# Patient Record
Sex: Male | Born: 1935 | Race: White | Hispanic: No | Marital: Married | Smoking: Former smoker
Health system: Southern US, Community
[De-identification: ages and names within clinical notes are randomized; demographics above are authoritative.]

## PROBLEM LIST (undated history)

## (undated) DIAGNOSIS — M069 Rheumatoid arthritis, unspecified: Secondary | ICD-10-CM

## (undated) DIAGNOSIS — Z79899 Other long term (current) drug therapy: Secondary | ICD-10-CM

## (undated) DIAGNOSIS — E119 Type 2 diabetes mellitus without complications: Secondary | ICD-10-CM

## (undated) DIAGNOSIS — Z951 Presence of aortocoronary bypass graft: Secondary | ICD-10-CM

## (undated) DIAGNOSIS — Z9889 Other specified postprocedural states: Secondary | ICD-10-CM

## (undated) DIAGNOSIS — Z8631 Personal history of diabetic foot ulcer: Secondary | ICD-10-CM

## (undated) DIAGNOSIS — I739 Peripheral vascular disease, unspecified: Secondary | ICD-10-CM

## (undated) DIAGNOSIS — I6529 Occlusion and stenosis of unspecified carotid artery: Secondary | ICD-10-CM

## (undated) DIAGNOSIS — G894 Chronic pain syndrome: Secondary | ICD-10-CM

## (undated) DIAGNOSIS — G629 Polyneuropathy, unspecified: Secondary | ICD-10-CM

## (undated) DIAGNOSIS — E785 Hyperlipidemia, unspecified: Secondary | ICD-10-CM

## (undated) HISTORY — PX: SKIN GRAFT: SHX250

## (undated) HISTORY — PX: CORONARY ARTERY BYPASS GRAFT: SHX141

## (undated) HISTORY — PX: CHOLECYSTECTOMY: SHX55

## (undated) HISTORY — PX: FEMORAL-POPLITEAL BYPASS GRAFT: SHX937

---

## 2005-06-02 ENCOUNTER — Ambulatory Visit (HOSPITAL_BASED_OUTPATIENT_CLINIC_OR_DEPARTMENT_OTHER): Admission: RE | Admit: 2005-06-02 | Discharge: 2005-06-02 | Payer: Self-pay | Admitting: Internal Medicine

## 2005-06-15 ENCOUNTER — Ambulatory Visit: Payer: Self-pay | Admitting: Internal Medicine

## 2005-08-01 ENCOUNTER — Ambulatory Visit: Payer: Self-pay | Admitting: Internal Medicine

## 2005-08-20 ENCOUNTER — Ambulatory Visit (HOSPITAL_BASED_OUTPATIENT_CLINIC_OR_DEPARTMENT_OTHER): Admission: RE | Admit: 2005-08-20 | Discharge: 2005-08-20 | Payer: Self-pay | Admitting: Internal Medicine

## 2005-08-24 ENCOUNTER — Ambulatory Visit: Payer: Self-pay | Admitting: Internal Medicine

## 2005-09-16 ENCOUNTER — Ambulatory Visit: Payer: Self-pay | Admitting: Internal Medicine

## 2005-10-30 ENCOUNTER — Ambulatory Visit: Payer: Self-pay | Admitting: Internal Medicine

## 2005-12-04 ENCOUNTER — Ambulatory Visit: Payer: Self-pay | Admitting: Internal Medicine

## 2013-06-16 ENCOUNTER — Encounter (HOSPITAL_BASED_OUTPATIENT_CLINIC_OR_DEPARTMENT_OTHER): Payer: Medicare Other | Attending: Internal Medicine

## 2013-06-16 DIAGNOSIS — I7789 Other specified disorders of arteries and arterioles: Secondary | ICD-10-CM | POA: Insufficient documentation

## 2013-06-16 DIAGNOSIS — T8189XA Other complications of procedures, not elsewhere classified, initial encounter: Secondary | ICD-10-CM | POA: Insufficient documentation

## 2013-06-16 DIAGNOSIS — Y832 Surgical operation with anastomosis, bypass or graft as the cause of abnormal reaction of the patient, or of later complication, without mention of misadventure at the time of the procedure: Secondary | ICD-10-CM | POA: Insufficient documentation

## 2013-06-16 DIAGNOSIS — Z951 Presence of aortocoronary bypass graft: Secondary | ICD-10-CM | POA: Insufficient documentation

## 2013-06-16 DIAGNOSIS — L97509 Non-pressure chronic ulcer of other part of unspecified foot with unspecified severity: Secondary | ICD-10-CM | POA: Insufficient documentation

## 2013-06-16 DIAGNOSIS — E1142 Type 2 diabetes mellitus with diabetic polyneuropathy: Secondary | ICD-10-CM | POA: Insufficient documentation

## 2013-06-16 DIAGNOSIS — Z794 Long term (current) use of insulin: Secondary | ICD-10-CM | POA: Insufficient documentation

## 2013-06-16 DIAGNOSIS — E1169 Type 2 diabetes mellitus with other specified complication: Secondary | ICD-10-CM | POA: Insufficient documentation

## 2013-06-16 DIAGNOSIS — Z79899 Other long term (current) drug therapy: Secondary | ICD-10-CM | POA: Insufficient documentation

## 2013-06-16 DIAGNOSIS — E1149 Type 2 diabetes mellitus with other diabetic neurological complication: Secondary | ICD-10-CM | POA: Insufficient documentation

## 2013-06-16 DIAGNOSIS — I872 Venous insufficiency (chronic) (peripheral): Secondary | ICD-10-CM | POA: Insufficient documentation

## 2013-06-16 DIAGNOSIS — I739 Peripheral vascular disease, unspecified: Secondary | ICD-10-CM | POA: Insufficient documentation

## 2013-06-16 DIAGNOSIS — T82898A Other specified complication of vascular prosthetic devices, implants and grafts, initial encounter: Secondary | ICD-10-CM | POA: Insufficient documentation

## 2013-06-17 NOTE — Progress Notes (Signed)
[facility]  NAME:  Nathaniel Serrano, Nathaniel Serrano NO.:  [id number]  MEDICAL RECORD NO.:  [id number]      DATE OF BIRTH:  Jan 06, 1936  PHYSICIAN:  Nathaniel Serrano, M.D.      VISIT DATE:                                  OFFICE VISIT   HISTORY:  Nathaniel Serrano is a 77 year old man who is essentially referred here by his in-home health nurse at [place].  He is here for wounds on his right foot.  He brings records from his primary physician at [facility] in [place] and also a recent followup from [facility], Nathaniel Serrano.  The history is largely supplied by his wife who is present.  Apparently, this gentleman had been followed at [facility] for a wound on his right second toe.  Apparently in the early part of this year in February, he developed a blister over the fifth metatarsal head.  This became infected.  He was admitted to hospital and underwent a "double bypass" on February 21 by Nathaniel Serrano. The distal part of the bypass is now occluded (DP bypass).  He has a peroneal bypass that is still present. Through this, he has been left with two open areas, one over the amputation site on the right fifth toe, and there was also a surgical wound on the dorsal aspect of the right foot.  They have been cleaning this meticulously and using Silvadene cream.  Home health is coming into see this once a week, and they are largely referred here by home health to see if there is anything else that can be done to the remaining surgical wound sites in spite of his obvious concerns about arterial insufficiency.  PAST MEDICAL HISTORY:  Taken from his notes from [facility] and includes history of coronary artery bypass, cholecystectomy, hemilaminectomy at L1, lithotripsy, previous attempts at bypass surgery with a fem-pop bypass in 2008 and a right popliteal and superficial artery laser arthrectomy also I  believe in 2008. Prostatectomy with history of BPH, type 2 diabetes, seizures, depression.  MEDICATIONS:  Lorazepam 0.5 b.i.d. p.r.n., oxycodone 15 mg p.o. q.4 p.r.n. amlodipine 5 mg daily, ASA 81 daily, metoprolol tartrate 25 two times daily, Lantus insulin 40 units in the morning and 45 units at night, Humalog sliding scale 3 times a day, Plavix 75 daily, hydroxyzine 10 mg 4 times daily, Remeron 7.5 at night, Lipitor 20 daily, Dilaudid 2 mg p.o. q.6 h. p.r.n., fentanyl 50 mg changed every third day, Augmentin 1 mg b.i.d.  PHYSICAL EXAMINATION:  VITAL SIGNS:  Temperature is 97.7, pulse 64, respirations 16, blood pressure 163/70, CBG is 122. VASCULAR:  I could not palpate a dorsalis pedis pulse or posterior tibial pulse on the right foot.  His capillary refill time was prolonged.  We could not obtain an ABI in this clinic.  He has probably severe venous stasis secondary lymphedema in the right leg.  However, the wound issue here is on the right fifth toe over the metatarsal head area laterally.  This measures 3 x 1 x 0.9. There are islands of epithelialization here.  Some dry wound remains.  The superior aspect of this wound had a small open area recess; however, I probed  this.  This does not drain.  This did not probe superiorly.  Over the right medial foot dorsally is a wound measuring 3.9 x 2.1 x 0.5. This underwent a nonselective debridement of surface adherent slough and eschar.  I could not clean this completely.  There is considerable depth to this, but no evidence of infection at either site is noted.  IMPRESSION AND PLAN:  Ischemic wounds in the setting of type 2 diabetes, diabetic neuropathy, and macrovascular disease.  He had a recent attempt at revascularization in February.  Apparently, the DP bypass part of this is now occluded.  In spite of this, he has already had some healing.  I think it may be possible to tease some more healing out of both of these surgical sites  in spite of his vascular limitations. Towards that end, we have applied Santyl Medihoney to the wound on his dorsal foot.  To the wound on the lateral foot over the area of the right fifth metatarsal head, we have applied a collagen Hydrogel-based dressing.  We have wrapped this and are going to leave this intact for the next week.  If we can get a better healing base to the dorsal foot wound, then Apligraf might be an option.  I have explained this in detail to the patient and his wife in painstaking fashion.  I do not believe this contradicts anything they have been told by other doctors. If infection sets in to either one of these wounds, the likely outcome of this would be an amputation, and both the patient and his wife are aware of this.  Even in the absence of this scenario, it may not be possible to get closure of these sites.  However, the patient wishes to try, and we have talked about this in some detail and will proceed with an attempt to get some closure of both of these areas.  We will see him again in a week's time.          ______________________________ Nathaniel Serrano, M.D.     MGR/MEDQ  D:  06/16/2013  T:  06/16/2013  Job:  [id number]

## 2013-07-07 ENCOUNTER — Encounter (HOSPITAL_BASED_OUTPATIENT_CLINIC_OR_DEPARTMENT_OTHER): Payer: Medicare Other | Attending: Internal Medicine

## 2013-07-07 DIAGNOSIS — I872 Venous insufficiency (chronic) (peripheral): Secondary | ICD-10-CM | POA: Insufficient documentation

## 2013-07-07 DIAGNOSIS — E1169 Type 2 diabetes mellitus with other specified complication: Secondary | ICD-10-CM | POA: Insufficient documentation

## 2013-07-07 DIAGNOSIS — L97509 Non-pressure chronic ulcer of other part of unspecified foot with unspecified severity: Secondary | ICD-10-CM | POA: Insufficient documentation

## 2013-07-07 DIAGNOSIS — I739 Peripheral vascular disease, unspecified: Secondary | ICD-10-CM | POA: Insufficient documentation

## 2013-08-04 ENCOUNTER — Encounter (HOSPITAL_BASED_OUTPATIENT_CLINIC_OR_DEPARTMENT_OTHER): Payer: Medicare Other | Attending: Internal Medicine

## 2013-08-04 DIAGNOSIS — E1169 Type 2 diabetes mellitus with other specified complication: Secondary | ICD-10-CM | POA: Insufficient documentation

## 2013-08-04 DIAGNOSIS — I872 Venous insufficiency (chronic) (peripheral): Secondary | ICD-10-CM | POA: Insufficient documentation

## 2013-08-04 DIAGNOSIS — L97509 Non-pressure chronic ulcer of other part of unspecified foot with unspecified severity: Secondary | ICD-10-CM | POA: Insufficient documentation

## 2013-08-04 DIAGNOSIS — I7389 Other specified peripheral vascular diseases: Secondary | ICD-10-CM | POA: Insufficient documentation

## 2013-09-01 ENCOUNTER — Encounter (HOSPITAL_BASED_OUTPATIENT_CLINIC_OR_DEPARTMENT_OTHER): Payer: Medicare Other | Attending: Internal Medicine

## 2013-09-01 DIAGNOSIS — L97509 Non-pressure chronic ulcer of other part of unspecified foot with unspecified severity: Secondary | ICD-10-CM | POA: Insufficient documentation

## 2013-09-01 DIAGNOSIS — I739 Peripheral vascular disease, unspecified: Secondary | ICD-10-CM | POA: Insufficient documentation

## 2013-09-01 DIAGNOSIS — E1169 Type 2 diabetes mellitus with other specified complication: Secondary | ICD-10-CM | POA: Insufficient documentation

## 2013-09-01 DIAGNOSIS — I872 Venous insufficiency (chronic) (peripheral): Secondary | ICD-10-CM | POA: Insufficient documentation

## 2013-10-06 ENCOUNTER — Encounter (HOSPITAL_BASED_OUTPATIENT_CLINIC_OR_DEPARTMENT_OTHER): Payer: Medicare Other | Attending: Internal Medicine

## 2013-10-06 DIAGNOSIS — L89899 Pressure ulcer of other site, unspecified stage: Secondary | ICD-10-CM | POA: Insufficient documentation

## 2013-10-06 DIAGNOSIS — L8992 Pressure ulcer of unspecified site, stage 2: Secondary | ICD-10-CM | POA: Insufficient documentation

## 2013-10-06 DIAGNOSIS — I739 Peripheral vascular disease, unspecified: Secondary | ICD-10-CM | POA: Insufficient documentation

## 2013-11-05 ENCOUNTER — Encounter (HOSPITAL_COMMUNITY): Payer: Self-pay | Admitting: Emergency Medicine

## 2013-11-05 ENCOUNTER — Emergency Department (HOSPITAL_COMMUNITY)
Admission: EM | Admit: 2013-11-05 | Discharge: 2013-11-06 | Disposition: A | Discharge status: Home / Self Care | Payer: Medicare Other | Attending: Emergency Medicine | Admitting: Emergency Medicine

## 2013-11-05 DIAGNOSIS — R110 Nausea: Secondary | ICD-10-CM | POA: Insufficient documentation

## 2013-11-05 DIAGNOSIS — Z8739 Personal history of other diseases of the musculoskeletal system and connective tissue: Secondary | ICD-10-CM | POA: Insufficient documentation

## 2013-11-05 DIAGNOSIS — M25569 Pain in unspecified knee: Secondary | ICD-10-CM | POA: Insufficient documentation

## 2013-11-05 DIAGNOSIS — R6883 Chills (without fever): Secondary | ICD-10-CM | POA: Insufficient documentation

## 2013-11-05 DIAGNOSIS — M2550 Pain in unspecified joint: Secondary | ICD-10-CM

## 2013-11-05 DIAGNOSIS — R509 Fever, unspecified: Secondary | ICD-10-CM | POA: Insufficient documentation

## 2013-11-05 DIAGNOSIS — Z9889 Other specified postprocedural states: Secondary | ICD-10-CM | POA: Insufficient documentation

## 2013-11-05 DIAGNOSIS — E119 Type 2 diabetes mellitus without complications: Secondary | ICD-10-CM | POA: Insufficient documentation

## 2013-11-05 DIAGNOSIS — Z951 Presence of aortocoronary bypass graft: Secondary | ICD-10-CM | POA: Insufficient documentation

## 2013-11-05 DIAGNOSIS — E871 Hypo-osmolality and hyponatremia: Secondary | ICD-10-CM

## 2013-11-05 DIAGNOSIS — R630 Anorexia: Secondary | ICD-10-CM | POA: Insufficient documentation

## 2013-11-05 HISTORY — DX: Type 2 diabetes mellitus without complications: E11.9

## 2013-11-05 HISTORY — DX: Other specified postprocedural states: Z98.890

## 2013-11-05 NOTE — ED Notes (Signed)
Pt reports having bypass surgery to the right leg due to circulation issues. Pt has been seen at wound center and now has skin over wounds on feet. Pt has had rheumatoid arthritis and has been off his medication to try to heal the right leg. This has caused worsening rheumatoid arthritis symptoms to the left knee. The patient reports swelling and pain to the knee. Pt also reports nausea today. Pt presents to ED because "their is infection somewhere in his body causing the fever." Pt was recently seen at [facility] ED, which associated infection to the left knee, however the patient's rheumatologist disagreed with that finding.

## 2013-11-06 LAB — COMPREHENSIVE METABOLIC PANEL
ALT: 8 U/L (ref 0–53)
Albumin: 3.4 g/dL — ABNORMAL LOW (ref 3.5–5.2)
Alkaline Phosphatase: 115 U/L (ref 39–117)
CO2: 20 mEq/L (ref 19–32)
Calcium: 9.2 mg/dL (ref 8.4–10.5)
Chloride: 95 mEq/L — ABNORMAL LOW (ref 96–112)
Creatinine, Ser: 1.5 mg/dL — ABNORMAL HIGH (ref 0.50–1.35)
GFR calc Af Amer: 51 mL/min — ABNORMAL LOW (ref >90)
GFR calc non Af Amer: 44 mL/min — ABNORMAL LOW (ref >90)
Glucose, Bld: 198 mg/dL — ABNORMAL HIGH (ref 70–99)
Potassium: 4.3 mEq/L (ref 3.5–5.1)
Sodium: 128 mEq/L — ABNORMAL LOW (ref 135–145)
Total Bilirubin: 0.7 mg/dL (ref 0.3–1.2)

## 2013-11-06 LAB — URINALYSIS, ROUTINE W REFLEX MICROSCOPIC
Glucose, UA: 100 mg/dL — AB
Ketones, ur: 15 mg/dL — AB
Leukocytes, UA: NEGATIVE
Protein, ur: 100 mg/dL — AB
Specific Gravity, Urine: 1.023 (ref 1.005–1.030)
Urobilinogen, UA: 0.2 mg/dL (ref 0.0–1.0)

## 2013-11-06 LAB — URINE MICROSCOPIC-ADD ON

## 2013-11-06 LAB — CBC WITH DIFFERENTIAL/PLATELET
Eosinophils Relative: 0 % (ref 0–5)
Hemoglobin: 12.2 g/dL — ABNORMAL LOW (ref 13.0–17.0)
Lymphocytes Relative: 3 % — ABNORMAL LOW (ref 12–46)
Lymphs Abs: 0.3 10*3/uL — ABNORMAL LOW (ref 0.7–4.0)
MCV: 81.3 fL (ref 78.0–100.0)
Monocytes Relative: 5 % (ref 3–12)
Neutrophils Relative %: 92 % — ABNORMAL HIGH (ref 43–77)
Platelets: 280 10*3/uL (ref 150–400)
RBC: 4.5 MIL/uL (ref 4.22–5.81)
RDW: 16.8 % — ABNORMAL HIGH (ref 11.5–15.5)
WBC: 11.3 10*3/uL — ABNORMAL HIGH (ref 4.0–10.5)

## 2013-11-06 LAB — CG4 I-STAT (LACTIC ACID): Lactic Acid, Venous: 2.11 mmol/L (ref 0.5–2.2)

## 2013-11-06 MED ORDER — OXYCODONE-ACETAMINOPHEN 5-325 MG PO TABS
2.0000 | ORAL_TABLET | Freq: Once | ORAL | Status: AC
  Administered 2013-11-06: 2 via ORAL
  Filled 2013-11-06: qty 2

## 2013-11-06 MED ORDER — ONDANSETRON HCL 4 MG/2ML IJ SOLN: 4.0000 mg | Freq: Once | INTRAMUSCULAR | Status: DC

## 2013-11-06 MED ORDER — HYDROMORPHONE HCL PF 1 MG/ML IJ SOLN
0.5000 mg | Freq: Once | INTRAMUSCULAR | Status: AC
  Administered 2013-11-06: 0.5 mg via INTRAVENOUS
  Filled 2013-11-06: qty 1

## 2013-11-06 MED ORDER — ONDANSETRON 8 MG PO TBDP: 8.0000 mg | ORAL_TABLET | Freq: Three times a day (TID) | ORAL | Status: DC | PRN

## 2013-11-06 NOTE — ED Provider Notes (Signed)
CSN: [id number]     Arrival date & time 11/05/13  2230 History   First MD Initiated Contact with Patient 11/05/13 2327     Chief Complaint  Patient presents with  . Fever  . Chills   (Consider location/radiation/quality/duration/timing/severity/associated sxs/prior Treatment) HPI 77 year old male presents to emergency room from home with complaint of fever to 100.5, chills, decreased appetite today with some mild nausea.  No cough, no upper respiratory symptoms.  No vomiting.  No abdominal pain.  No urinary symptoms.  Wife reports patient has had some low blood sugars today, primary care Dr. has been manipulating his diabetic medications.  Patient with history of CABG and right lower extremity vascular surgery with subsequent wound healing problems.  Patient has been seen at the wound care center, and has had improved healing.  Patient with history of rheumatoid arthritis, had been off methotrexate as wound healing and was not progressing.  He is since been placed back on his methotrexate.  Rheumatoid symptoms and upper extremities have improved, but still has consistent pain and swelling to his left knee.  Patient reports being seen at [facility].  Several months ago, and being told that he had infection in his left knee.  He was on antibiotics for several weeks.  He reports that his rheumatologist did not feel that he had an infection in the knee.  Patient and wife wanting to know where source of fever is today.  Past Medical History  Diagnosis Date  . History of open heart surgery   . Diabetes mellitus without complication    History reviewed. No pertinent past surgical history. No family history on file. History  Substance Use Topics  . Smoking status: Never Smoker   . Smokeless tobacco: Never Used  . Alcohol Use: No    Review of Systems  See History of Present Illness; otherwise all other systems are reviewed and negative  Allergies  Review of patient's allergies indicates  no known allergies.  Home Medications  No current outpatient prescriptions on file. BP 154/59  Pulse 74  Temp(Src) 98.5 F (36.9 C) (Oral)  Resp 18  SpO2 100% Physical Exam  Nursing note and vitals reviewed. Constitutional: He is oriented to person, place, and time. He appears well-developed and well-nourished.  HENT:  Head: Normocephalic and atraumatic.  Right Ear: External ear normal.  Left Ear: External ear normal.  Nose: Nose normal.  Mouth/Throat: Oropharynx is clear and moist.  Eyes: Conjunctivae and EOM are normal. Pupils are equal, round, and reactive to light.  Neck: Normal range of motion. Neck supple. No JVD present. No tracheal deviation present. No thyromegaly present.  Cardiovascular: Normal rate, regular rhythm, normal heart sounds and intact distal pulses.  Exam reveals no gallop and no friction rub.   No murmur heard. Pulmonary/Chest: Effort normal and breath sounds normal. No stridor. No respiratory distress. He has no wheezes. He has no rales. He exhibits no tenderness.  Abdominal: Soft. Bowel sounds are normal. He exhibits no distension and no mass. There is no tenderness. There is no rebound and no guarding.  Musculoskeletal: Normal range of motion. He exhibits edema and tenderness.  Swelling and tenderness to left knee.  No overlying erythema or warmth.  No crepitus.  Lymphadenopathy:    He has no cervical adenopathy.  Neurological: He is alert and oriented to person, place, and time. He has normal reflexes. No cranial nerve deficit. He exhibits normal muscle tone. Coordination normal.  Skin: Skin is warm and dry. No  rash noted. No erythema. No pallor.  Psychiatric: He has a normal mood and affect. His behavior is normal. Judgment and thought content normal.    ED Course  Procedures (including critical care time) Labs Review Labs Reviewed  CBC WITH DIFFERENTIAL - Abnormal; Notable for the following:    WBC 11.3 (*)    Hemoglobin 12.2 (*)    HCT 36.6 (*)     RDW 16.8 (*)    Neutrophils Relative % 92 (*)    Neutro Abs 10.4 (*)    Lymphocytes Relative 3 (*)    Lymphs Abs 0.3 (*)    All other components within normal limits  COMPREHENSIVE METABOLIC PANEL - Abnormal; Notable for the following:    Sodium 128 (*)    Chloride 95 (*)    Glucose, Bld 198 (*)    BUN 26 (*)    Creatinine, Ser 1.50 (*)    Albumin 3.4 (*)    GFR calc non Af Amer 44 (*)    GFR calc Af Amer 51 (*)    All other components within normal limits  URINALYSIS, ROUTINE W REFLEX MICROSCOPIC - Abnormal; Notable for the following:    Color, Urine AMBER (*)    Glucose, UA 100 (*)    Hgb urine dipstick SMALL (*)    Bilirubin Urine SMALL (*)    Ketones, ur 15 (*)    Protein, ur 100 (*)    All other components within normal limits  URINE MICROSCOPIC-ADD ON - Abnormal; Notable for the following:    Casts GRANULAR CAST (*)    All other components within normal limits  CULTURE, BLOOD (ROUTINE X 2)  CULTURE, BLOOD (ROUTINE X 2)  CG4 I-STAT (LACTIC ACID)   Imaging Review No results found.  EKG Interpretation   None       MDM   1. Fever   2. Arthralgia   3. Hyponatremia    77 year old male with low-grade temp today, chills, myalgias, and mild nausea.  No specific source of fever noted.  On exam.  We'll check labs.  Sent for blood cultures given history of methotrexate.   Olivia Mackie, MD 11/06/13 [phone]

## 2013-11-08 ENCOUNTER — Telehealth (HOSPITAL_BASED_OUTPATIENT_CLINIC_OR_DEPARTMENT_OTHER): Payer: Self-pay

## 2013-11-08 ENCOUNTER — Telehealth (HOSPITAL_COMMUNITY): Payer: Self-pay

## 2013-11-08 NOTE — Telephone Encounter (Signed)
Fayrene Helper PA reviewed pts labs and chart. Advised to call and check on pt. Have him return to ED for re-evaluation due to positive blood cultures- ram positive rods. I have attempted to call pt twice, will continue to try to reach pt.

## 2013-11-08 NOTE — ED Notes (Signed)
Patient informed of need to return for re-evaluation of + Blood gram positive rods.

## 2013-11-09 ENCOUNTER — Emergency Department (HOSPITAL_COMMUNITY): Payer: Medicare Other

## 2013-11-09 ENCOUNTER — Telehealth (HOSPITAL_COMMUNITY): Payer: Self-pay

## 2013-11-09 ENCOUNTER — Encounter (HOSPITAL_COMMUNITY): Payer: Self-pay | Admitting: Emergency Medicine

## 2013-11-09 ENCOUNTER — Emergency Department (HOSPITAL_COMMUNITY)
Admission: EM | Admit: 2013-11-09 | Discharge: 2013-11-09 | Disposition: A | Discharge status: Home / Self Care | Payer: Medicare Other | Attending: Emergency Medicine | Admitting: Emergency Medicine

## 2013-11-09 DIAGNOSIS — Z7902 Long term (current) use of antithrombotics/antiplatelets: Secondary | ICD-10-CM | POA: Insufficient documentation

## 2013-11-09 DIAGNOSIS — R7989 Other specified abnormal findings of blood chemistry: Secondary | ICD-10-CM | POA: Insufficient documentation

## 2013-11-09 DIAGNOSIS — R7881 Bacteremia: Secondary | ICD-10-CM | POA: Diagnosis present

## 2013-11-09 DIAGNOSIS — Z79899 Other long term (current) drug therapy: Secondary | ICD-10-CM | POA: Insufficient documentation

## 2013-11-09 DIAGNOSIS — Z794 Long term (current) use of insulin: Secondary | ICD-10-CM | POA: Insufficient documentation

## 2013-11-09 DIAGNOSIS — M25469 Effusion, unspecified knee: Secondary | ICD-10-CM | POA: Insufficient documentation

## 2013-11-09 DIAGNOSIS — E119 Type 2 diabetes mellitus without complications: Secondary | ICD-10-CM | POA: Insufficient documentation

## 2013-11-09 DIAGNOSIS — Z951 Presence of aortocoronary bypass graft: Secondary | ICD-10-CM | POA: Insufficient documentation

## 2013-11-09 DIAGNOSIS — Z7982 Long term (current) use of aspirin: Secondary | ICD-10-CM | POA: Insufficient documentation

## 2013-11-09 LAB — COMPREHENSIVE METABOLIC PANEL WITH GFR
ALT: 8 U/L (ref 0–53)
AST: 13 U/L (ref 0–37)
Albumin: 3.2 g/dL — ABNORMAL LOW (ref 3.5–5.2)
Alkaline Phosphatase: 111 U/L (ref 39–117)
BUN: 28 mg/dL — ABNORMAL HIGH (ref 6–23)
CO2: 25 meq/L (ref 19–32)
Calcium: 9.4 mg/dL (ref 8.4–10.5)
Chloride: 99 meq/L (ref 96–112)
Creatinine, Ser: 1.54 mg/dL — ABNORMAL HIGH (ref 0.50–1.35)
GFR calc Af Amer: 49 mL/min — ABNORMAL LOW
GFR calc non Af Amer: 42 mL/min — ABNORMAL LOW
Glucose, Bld: 240 mg/dL — ABNORMAL HIGH (ref 70–99)
Potassium: 4.4 meq/L (ref 3.5–5.1)
Sodium: 135 meq/L (ref 135–145)
Total Bilirubin: 0.5 mg/dL (ref 0.3–1.2)
Total Protein: 6.9 g/dL (ref 6.0–8.3)

## 2013-11-09 LAB — URINALYSIS W MICROSCOPIC + REFLEX CULTURE
Bilirubin Urine: NEGATIVE
Glucose, UA: 100 mg/dL — AB
Hgb urine dipstick: NEGATIVE
Ketones, ur: NEGATIVE mg/dL
Leukocytes, UA: NEGATIVE
Nitrite: NEGATIVE
Protein, ur: 100 mg/dL — AB
Specific Gravity, Urine: 1.022 (ref 1.005–1.030)
Urobilinogen, UA: 1 mg/dL (ref 0.0–1.0)
pH: 5.5 (ref 5.0–8.0)

## 2013-11-09 LAB — CBC WITH DIFFERENTIAL/PLATELET
Basophils Absolute: 0 10*3/uL (ref 0.0–0.1)
Eosinophils Absolute: 0.4 10*3/uL (ref 0.0–0.7)
Hemoglobin: 11.4 g/dL — ABNORMAL LOW (ref 13.0–17.0)
Lymphs Abs: 0.5 10*3/uL — ABNORMAL LOW (ref 0.7–4.0)
MCH: 27.3 pg (ref 26.0–34.0)
Monocytes Relative: 9 % (ref 3–12)
Neutro Abs: 4.8 10*3/uL (ref 1.7–7.7)
Neutrophils Relative %: 77 % (ref 43–77)
Platelets: 331 10*3/uL (ref 150–400)
RBC: 4.18 MIL/uL — ABNORMAL LOW (ref 4.22–5.81)

## 2013-11-09 NOTE — ED Notes (Signed)
Pt reports abnormal lab work form visit on Saturday to the ED.  Pt is unsure of what the abnormal lab is.

## 2013-11-09 NOTE — ED Notes (Signed)
Call from PCP Dr Derrell Lolling  Concerned w/fax he rcvd stating "Blood Cx Bacterial Culture positive".  Md informed Preliminary blood cx from 12/7 (12/6 visit) had 1 set grow gram (+) rods and the 2nd set w/no growth.  Pt called back for recheck and bld cx x 2 redrawn and pt sent home.   Read ED MD findings from today's visit to PCP and faxed ED MD's note to PCP office [phone] for further review.

## 2013-11-09 NOTE — ED Provider Notes (Addendum)
CSN: [id number]     Arrival date & time 11/09/13  1200 History   First MD Initiated Contact with Patient 11/09/13 1244     Chief Complaint  Patient presents with  . Abnormal labs    (Consider location/radiation/quality/duration/timing/severity/associated sxs/prior Treatment) HPI Comments:  77 y.o. male w hx of DM, CABG, failed bypass graft to RLE who pw blood cx which was positive for gram positive rods. The other blood cx drawn at that time is negative to date. The patient was seen here 3 days ago for chills, arthralgias, and low-grade temperature. He had screening labwork and blood cultures sent due to the fact that he was on methotrexate. He was ultimately discharged home. He has continued to do well at home. He was seen by his rheumatologist yesterday who gave him an intramuscular shot of methotrexate. He states that he has been afebrile and is doing well at home. He is afebrile (rectal temp) and vital signs are unremarkable here. He has no complaints.     Past Medical History  Diagnosis Date  . History of open heart surgery   . Diabetes mellitus without complication    History reviewed. No pertinent past surgical history. History reviewed. No pertinent family history. History  Substance Use Topics  . Smoking status: Never Smoker   . Smokeless tobacco: Never Used  . Alcohol Use: No    Review of Systems  Constitutional: Negative for fever.  HENT: Negative for drooling and rhinorrhea.   Eyes: Negative for pain.  Respiratory: Negative for cough and shortness of breath.   Cardiovascular: Negative for chest pain and leg swelling.  Gastrointestinal: Negative for nausea, vomiting, abdominal pain and diarrhea.  Genitourinary: Negative for dysuria and hematuria.  Musculoskeletal: Negative for gait problem and neck pain.  Skin: Negative for color change.  Neurological: Negative for numbness and headaches.  Hematological: Negative for adenopathy.  Psychiatric/Behavioral: Negative for  behavioral problems.  All other systems reviewed and are negative.    Allergies  Review of patient's allergies indicates no known allergies.  Home Medications   Current Outpatient Rx  Name  Route  Sig  Dispense  Refill  . amLODipine (NORVASC) 5 MG tablet   Oral   Take 1 tablet by mouth daily.         Marland Kitchen aspirin 81 MG tablet   Oral   Take 81 mg by mouth daily.         . clopidogrel (PLAVIX) 75 MG tablet   Oral   Take 75 mg by mouth daily with breakfast.         . fentaNYL (DURAGESIC - DOSED MCG/HR) 50 MCG/HR   Transdermal   Place 50 mcg onto the skin every 3 (three) days.         . folic acid (FOLVITE) 1 MG tablet   Oral   Take 1 tablet by mouth daily.         Marland Kitchen HUMALOG KWIKPEN 100 UNIT/ML SOPN   Subcutaneous   Inject 3-20 Units into the skin 4 (four) times daily.         . insulin glargine (LANTUS) 100 UNIT/ML injection   Subcutaneous   Inject 4 Units into the skin 2 (two) times daily. Said dosages just change takes 4units at night but doesn't remember am dosage         . methotrexate (RHEUMATREX) 2.5 MG tablet   Oral   Take 15 mg by mouth once a week. Caution:Chemotherapy. Protect from light.  Takes 6 2.5mg   tablets to =15mg          . metoprolol tartrate (LOPRESSOR) 25 MG tablet   Oral   Take 25 mg by mouth 2 (two) times daily.         . Oxycodone HCl 10 MG TABS   Oral   Take 10 mg by mouth at bedtime.         . polyethylene glycol (MIRALAX / GLYCOLAX) packet   Oral   Take 17 g by mouth daily.         . ondansetron (ZOFRAN ODT) 8 MG disintegrating tablet   Oral   Take 1 tablet (8 mg total) by mouth every 8 (eight) hours as needed for nausea or vomiting.   20 tablet   0    BP 134/63  Pulse 66  Temp(Src) 97.5 F (36.4 C) (Oral)  Resp 16  Ht 6\' 3"  (1.905 m)  Wt 240 lb (108.863 kg)  BMI 30.00 kg/m2  SpO2 100% Physical Exam  Nursing note and vitals reviewed. Constitutional: He is oriented to person, place, and time. He appears  well-developed and well-nourished.  HENT:  Head: Normocephalic and atraumatic.  Right Ear: External ear normal.  Left Ear: External ear normal.  Nose: Nose normal.  Mouth/Throat: Oropharynx is clear and moist. No oropharyngeal exudate.  Eyes: Conjunctivae and EOM are normal. Pupils are equal, round, and reactive to light.  Neck: Normal range of motion. Neck supple.  Cardiovascular: Normal rate, regular rhythm, normal heart sounds and intact distal pulses.  Exam reveals no gallop and no friction rub.   No murmur heard. Pulmonary/Chest: Effort normal and breath sounds normal. No respiratory distress. He has no wheezes.  Abdominal: Soft. Bowel sounds are normal. He exhibits no distension. There is no tenderness. There is no rebound and no guarding.  Musculoskeletal: Normal range of motion. He exhibits no edema and no tenderness.  Bilateral knee effusions. Normal appearing knees bilaterally. Normal range of motion in his knees bilaterally.  Lower extremities are pink, warm, and well perfused.  Several skin grafts to the right foot which appear to be healing well w/out evidence of infection.   Neurological: He is alert and oriented to person, place, and time.  Skin: Skin is warm and dry.  Psychiatric: He has a normal mood and affect. His behavior is normal.    ED Course  Procedures (including critical care time) Labs Review Labs Reviewed  CBC WITH DIFFERENTIAL - Abnormal; Notable for the following:    RBC 4.18 (*)    Hemoglobin 11.4 (*)    HCT 34.1 (*)    RDW 16.3 (*)    Lymphocytes Relative 8 (*)    Lymphs Abs 0.5 (*)    Eosinophils Relative 6 (*)    All other components within normal limits  COMPREHENSIVE METABOLIC PANEL - Abnormal; Notable for the following:    Glucose, Bld 240 (*)    BUN 28 (*)    Creatinine, Ser 1.54 (*)    Albumin 3.2 (*)    GFR calc non Af Amer 42 (*)    GFR calc Af Amer 49 (*)    All other components within normal limits  URINALYSIS W MICROSCOPIC +  REFLEX CULTURE - Abnormal; Notable for the following:    Glucose, UA 100 (*)    Protein, ur 100 (*)    Bacteria, UA FEW (*)    Casts HYALINE CASTS (*)    All other components within normal limits  CULTURE, BLOOD (ROUTINE X 2)  CULTURE,  BLOOD (ROUTINE X 2)   Imaging Review Dg Chest 2 View  11/09/2013   CLINICAL DATA:  Diabetes mellitus ; nonhealing wound right lower extremity  EXAM: CHEST  2 VIEW  COMPARISON:  August 11, 2013  FINDINGS: There is no edema or consolidation. The heart size and pulmonary vascularity are normal. No adenopathy. Patient is status post coronary artery bypass grafting. No bone lesions are appreciable.  IMPRESSION: No edema or consolidation.   Electronically Signed   By: Bretta Bang M.D.   On: 11/09/2013 14:03    EKG Interpretation   None       MDM   1. Blood bacterial culture positive    1:27 PM 77 y.o. male w hx of DM, CABG, failed bypass graft to RLE who pw blood cx which was positive for gram positive rods. The other blood cx drawn at that time is negative to date. The patient was seen here 3 days ago for chills, arthralgias, and low-grade temperature. He had screening labwork and blood cultures sent due to the fact that he was on methotrexate. He was ultimately discharged home. He has continued to do well at home. He was seen by his rheumatologist yesterday who gave him an intramuscular shot of methotrexate. He states that he has been afebrile and is doing well at home. He is afebrile (rectal temp) and vital signs are unremarkable here. Will get screening labwork and repeat blood cultures.  I discussed the case w/ ID. As the pt is afebrile and appears well it is likely that the blood cx has a contaminant. Diptheroids would be considered a contaminant, but will have to wait on final cx. I re cultured the pt here today. He continues to appear well on exam and remains asx.   3:29 PM:  I have discussed the diagnosis/risks/treatment options with the  patient and family and believe the pt to be eligible for discharge home to follow-up with pcp as needed. We also discussed returning to the ED immediately if new or worsening sx occur. We discussed the sx which are most concerning (e.g., fever, myalgias, chills, vomiting, diarrhea, or other new sx) that necessitate immediate return. Any new prescriptions provided to the patient are listed below.  New Prescriptions   No medications on file     Junius Argyle, MD 11/09/13 1547  Junius Argyle, MD 11/16/13 [phone]

## 2013-11-10 LAB — CULTURE, BLOOD (ROUTINE X 2)

## 2013-11-12 LAB — CULTURE, BLOOD (ROUTINE X 2)

## 2013-11-15 LAB — CULTURE, BLOOD (ROUTINE X 2): Culture: NO GROWTH

## 2013-11-15 NOTE — ED Notes (Signed)
[facility] called with results of labs- copy of blood cultures faxed to Dr Derrell Lolling @ [phone] attention Baird Lyons

## 2013-11-16 LAB — CULTURE, BLOOD (ROUTINE X 2)

## 2013-12-20 ENCOUNTER — Encounter: Payer: Self-pay | Admitting: Cardiothoracic Surgery

## 2013-12-22 ENCOUNTER — Encounter (HOSPITAL_BASED_OUTPATIENT_CLINIC_OR_DEPARTMENT_OTHER): Payer: Medicare Other | Attending: Internal Medicine

## 2013-12-22 DIAGNOSIS — L97509 Non-pressure chronic ulcer of other part of unspecified foot with unspecified severity: Secondary | ICD-10-CM | POA: Insufficient documentation

## 2013-12-22 DIAGNOSIS — E1169 Type 2 diabetes mellitus with other specified complication: Secondary | ICD-10-CM | POA: Insufficient documentation

## 2013-12-22 DIAGNOSIS — I739 Peripheral vascular disease, unspecified: Secondary | ICD-10-CM | POA: Insufficient documentation

## 2014-01-05 ENCOUNTER — Encounter (HOSPITAL_BASED_OUTPATIENT_CLINIC_OR_DEPARTMENT_OTHER): Payer: Medicare Other | Attending: Internal Medicine

## 2014-01-05 DIAGNOSIS — E1169 Type 2 diabetes mellitus with other specified complication: Secondary | ICD-10-CM | POA: Insufficient documentation

## 2014-01-05 DIAGNOSIS — I739 Peripheral vascular disease, unspecified: Secondary | ICD-10-CM | POA: Insufficient documentation

## 2014-01-05 DIAGNOSIS — L97509 Non-pressure chronic ulcer of other part of unspecified foot with unspecified severity: Secondary | ICD-10-CM | POA: Insufficient documentation

## 2014-01-12 ENCOUNTER — Observation Stay (HOSPITAL_COMMUNITY)
Admission: EM | Admit: 2014-01-12 | Discharge: 2014-01-14 | Disposition: A | Discharge status: Home / Self Care | Payer: Medicare Other | Attending: Internal Medicine | Admitting: Internal Medicine

## 2014-01-12 ENCOUNTER — Emergency Department (HOSPITAL_COMMUNITY): Payer: Medicare Other

## 2014-01-12 ENCOUNTER — Encounter (HOSPITAL_COMMUNITY): Payer: Self-pay | Admitting: Emergency Medicine

## 2014-01-12 DIAGNOSIS — Z794 Long term (current) use of insulin: Secondary | ICD-10-CM | POA: Insufficient documentation

## 2014-01-12 DIAGNOSIS — Z79620 Long term (current) use of immunosuppressive biologic: Secondary | ICD-10-CM

## 2014-01-12 DIAGNOSIS — M069 Rheumatoid arthritis, unspecified: Secondary | ICD-10-CM | POA: Insufficient documentation

## 2014-01-12 DIAGNOSIS — E785 Hyperlipidemia, unspecified: Secondary | ICD-10-CM | POA: Insufficient documentation

## 2014-01-12 DIAGNOSIS — G894 Chronic pain syndrome: Secondary | ICD-10-CM | POA: Insufficient documentation

## 2014-01-12 DIAGNOSIS — E1139 Type 2 diabetes mellitus with other diabetic ophthalmic complication: Secondary | ICD-10-CM | POA: Insufficient documentation

## 2014-01-12 DIAGNOSIS — R5383 Other fatigue: Secondary | ICD-10-CM

## 2014-01-12 DIAGNOSIS — I872 Venous insufficiency (chronic) (peripheral): Secondary | ICD-10-CM | POA: Insufficient documentation

## 2014-01-12 DIAGNOSIS — Z8631 Personal history of diabetic foot ulcer: Secondary | ICD-10-CM | POA: Diagnosis present

## 2014-01-12 DIAGNOSIS — I2510 Atherosclerotic heart disease of native coronary artery without angina pectoris: Secondary | ICD-10-CM | POA: Insufficient documentation

## 2014-01-12 DIAGNOSIS — I739 Peripheral vascular disease, unspecified: Secondary | ICD-10-CM | POA: Insufficient documentation

## 2014-01-12 DIAGNOSIS — Z7902 Long term (current) use of antithrombotics/antiplatelets: Secondary | ICD-10-CM | POA: Insufficient documentation

## 2014-01-12 DIAGNOSIS — R21 Rash and other nonspecific skin eruption: Secondary | ICD-10-CM | POA: Insufficient documentation

## 2014-01-12 DIAGNOSIS — R509 Fever, unspecified: Principal | ICD-10-CM | POA: Insufficient documentation

## 2014-01-12 DIAGNOSIS — E113593 Type 2 diabetes mellitus with proliferative diabetic retinopathy without macular edema, bilateral: Secondary | ICD-10-CM | POA: Diagnosis present

## 2014-01-12 DIAGNOSIS — G629 Polyneuropathy, unspecified: Secondary | ICD-10-CM | POA: Diagnosis present

## 2014-01-12 DIAGNOSIS — M25469 Effusion, unspecified knee: Secondary | ICD-10-CM | POA: Insufficient documentation

## 2014-01-12 DIAGNOSIS — N189 Chronic kidney disease, unspecified: Secondary | ICD-10-CM | POA: Insufficient documentation

## 2014-01-12 DIAGNOSIS — R531 Weakness: Secondary | ICD-10-CM

## 2014-01-12 DIAGNOSIS — M25569 Pain in unspecified knee: Secondary | ICD-10-CM | POA: Diagnosis present

## 2014-01-12 DIAGNOSIS — R079 Chest pain, unspecified: Secondary | ICD-10-CM | POA: Insufficient documentation

## 2014-01-12 DIAGNOSIS — J3489 Other specified disorders of nose and nasal sinuses: Secondary | ICD-10-CM | POA: Insufficient documentation

## 2014-01-12 DIAGNOSIS — R5381 Other malaise: Secondary | ICD-10-CM | POA: Insufficient documentation

## 2014-01-12 DIAGNOSIS — I6529 Occlusion and stenosis of unspecified carotid artery: Secondary | ICD-10-CM | POA: Insufficient documentation

## 2014-01-12 DIAGNOSIS — I129 Hypertensive chronic kidney disease with stage 1 through stage 4 chronic kidney disease, or unspecified chronic kidney disease: Secondary | ICD-10-CM | POA: Insufficient documentation

## 2014-01-12 DIAGNOSIS — E11359 Type 2 diabetes mellitus with proliferative diabetic retinopathy without macular edema: Secondary | ICD-10-CM | POA: Insufficient documentation

## 2014-01-12 DIAGNOSIS — R0602 Shortness of breath: Secondary | ICD-10-CM | POA: Insufficient documentation

## 2014-01-12 DIAGNOSIS — Z7982 Long term (current) use of aspirin: Secondary | ICD-10-CM | POA: Insufficient documentation

## 2014-01-12 DIAGNOSIS — Z79899 Other long term (current) drug therapy: Secondary | ICD-10-CM

## 2014-01-12 DIAGNOSIS — Z951 Presence of aortocoronary bypass graft: Secondary | ICD-10-CM | POA: Insufficient documentation

## 2014-01-12 DIAGNOSIS — D72829 Elevated white blood cell count, unspecified: Secondary | ICD-10-CM | POA: Insufficient documentation

## 2014-01-12 HISTORY — DX: Occlusion and stenosis of unspecified carotid artery: I65.29

## 2014-01-12 HISTORY — DX: Other long term (current) drug therapy: Z79.899

## 2014-01-12 HISTORY — DX: Polyneuropathy, unspecified: G62.9

## 2014-01-12 HISTORY — DX: Rheumatoid arthritis, unspecified: M06.9

## 2014-01-12 HISTORY — DX: Personal history of diabetic foot ulcer: Z86.31

## 2014-01-12 HISTORY — DX: Chronic pain syndrome: G89.4

## 2014-01-12 HISTORY — DX: Peripheral vascular disease, unspecified: I73.9

## 2014-01-12 HISTORY — DX: Hyperlipidemia, unspecified: E78.5

## 2014-01-12 HISTORY — DX: Presence of aortocoronary bypass graft: Z95.1

## 2014-01-12 MED ORDER — HYDROMORPHONE HCL PF 1 MG/ML IJ SOLN
1.0000 mg | Freq: Once | INTRAMUSCULAR | Status: AC
  Administered 2014-01-13: 1 mg via INTRAVENOUS
  Filled 2014-01-12: qty 1

## 2014-01-12 MED ORDER — SODIUM CHLORIDE 0.9 % IV BOLUS (SEPSIS)
500.0000 mL | Freq: Once | INTRAVENOUS | Status: AC
  Administered 2014-01-13: 500 mL via INTRAVENOUS

## 2014-01-12 NOTE — ED Notes (Addendum)
Pt reports being at the wound center this morning his temperature was 97.6. Wife reports the patient's temperature was 100.5 before coming to the ED. Pt reports nausea. Pt reports generalized body joint aches, pt has rheumatoid arthritis and osteoarthritis. Pt states the patient has a history of a bypass in this right leg, which led to wounds on his foot which he has been being treated for at the wound care center, which the wife reports good progress and the wounds have been healing. Wife states she is concerned regarding infection.

## 2014-01-13 ENCOUNTER — Inpatient Hospital Stay (HOSPITAL_COMMUNITY): Payer: Medicare Other

## 2014-01-13 ENCOUNTER — Encounter (HOSPITAL_COMMUNITY): Payer: Self-pay | Admitting: Internal Medicine

## 2014-01-13 ENCOUNTER — Emergency Department (HOSPITAL_COMMUNITY): Payer: Medicare Other

## 2014-01-13 DIAGNOSIS — M069 Rheumatoid arthritis, unspecified: Secondary | ICD-10-CM

## 2014-01-13 DIAGNOSIS — R5381 Other malaise: Secondary | ICD-10-CM

## 2014-01-13 DIAGNOSIS — Z79899 Other long term (current) drug therapy: Secondary | ICD-10-CM

## 2014-01-13 DIAGNOSIS — Z8631 Personal history of diabetic foot ulcer: Secondary | ICD-10-CM

## 2014-01-13 DIAGNOSIS — I739 Peripheral vascular disease, unspecified: Secondary | ICD-10-CM

## 2014-01-13 DIAGNOSIS — G894 Chronic pain syndrome: Secondary | ICD-10-CM

## 2014-01-13 DIAGNOSIS — R5383 Other fatigue: Secondary | ICD-10-CM

## 2014-01-13 DIAGNOSIS — E785 Hyperlipidemia, unspecified: Secondary | ICD-10-CM

## 2014-01-13 DIAGNOSIS — G609 Hereditary and idiopathic neuropathy, unspecified: Secondary | ICD-10-CM

## 2014-01-13 DIAGNOSIS — Z8639 Personal history of other endocrine, nutritional and metabolic disease: Secondary | ICD-10-CM

## 2014-01-13 DIAGNOSIS — Z951 Presence of aortocoronary bypass graft: Secondary | ICD-10-CM

## 2014-01-13 DIAGNOSIS — E113593 Type 2 diabetes mellitus with proliferative diabetic retinopathy without macular edema, bilateral: Secondary | ICD-10-CM | POA: Diagnosis present

## 2014-01-13 DIAGNOSIS — M25569 Pain in unspecified knee: Secondary | ICD-10-CM | POA: Diagnosis present

## 2014-01-13 DIAGNOSIS — R509 Fever, unspecified: Secondary | ICD-10-CM | POA: Diagnosis present

## 2014-01-13 DIAGNOSIS — G629 Polyneuropathy, unspecified: Secondary | ICD-10-CM

## 2014-01-13 DIAGNOSIS — I6529 Occlusion and stenosis of unspecified carotid artery: Secondary | ICD-10-CM

## 2014-01-13 DIAGNOSIS — D72829 Elevated white blood cell count, unspecified: Secondary | ICD-10-CM | POA: Diagnosis present

## 2014-01-13 DIAGNOSIS — Z79620 Long term (current) use of immunosuppressive biologic: Secondary | ICD-10-CM

## 2014-01-13 DIAGNOSIS — Z862 Personal history of diseases of the blood and blood-forming organs and certain disorders involving the immune mechanism: Secondary | ICD-10-CM

## 2014-01-13 HISTORY — DX: Peripheral vascular disease, unspecified: I73.9

## 2014-01-13 HISTORY — DX: Other long term (current) drug therapy: Z79.899

## 2014-01-13 HISTORY — DX: Presence of aortocoronary bypass graft: Z95.1

## 2014-01-13 HISTORY — DX: Chronic pain syndrome: G89.4

## 2014-01-13 HISTORY — DX: Hyperlipidemia, unspecified: E78.5

## 2014-01-13 HISTORY — DX: Rheumatoid arthritis, unspecified: M06.9

## 2014-01-13 HISTORY — DX: Occlusion and stenosis of unspecified carotid artery: I65.29

## 2014-01-13 HISTORY — DX: Personal history of diabetic foot ulcer: Z86.31

## 2014-01-13 HISTORY — DX: Long term (current) use of immunosuppressive biologic: Z79.620

## 2014-01-13 HISTORY — DX: Polyneuropathy, unspecified: G62.9

## 2014-01-13 LAB — INFLUENZA PANEL BY PCR (TYPE A & B)
H1N1 flu by pcr: NOT DETECTED
Influenza A By PCR: NEGATIVE
Influenza B By PCR: NEGATIVE

## 2014-01-13 LAB — COMPREHENSIVE METABOLIC PANEL
ALT: 9 U/L (ref 0–53)
AST: 13 U/L (ref 0–37)
Albumin: 2.9 g/dL — ABNORMAL LOW (ref 3.5–5.2)
Alkaline Phosphatase: 91 U/L (ref 39–117)
BILIRUBIN TOTAL: 0.6 mg/dL (ref 0.3–1.2)
BUN: 24 mg/dL — AB (ref 6–23)
CHLORIDE: 103 meq/L (ref 96–112)
CO2: 21 meq/L (ref 19–32)
CREATININE: 1.59 mg/dL — AB (ref 0.50–1.35)
Calcium: 8.4 mg/dL (ref 8.4–10.5)
GFR calc Af Amer: 47 mL/min — ABNORMAL LOW (ref >90)
GFR, EST NON AFRICAN AMERICAN: 41 mL/min — AB (ref >90)
Glucose, Bld: 237 mg/dL — ABNORMAL HIGH (ref 70–99)
POTASSIUM: 4.9 meq/L (ref 3.7–5.3)
Sodium: 137 mEq/L (ref 137–147)
Total Protein: 5.6 g/dL — ABNORMAL LOW (ref 6.0–8.3)

## 2014-01-13 LAB — CBC
HCT: 34 % — ABNORMAL LOW (ref 39.0–52.0)
Hemoglobin: 11 g/dL — ABNORMAL LOW (ref 13.0–17.0)
MCH: 27.6 pg (ref 26.0–34.0)
MCHC: 32.4 g/dL (ref 30.0–36.0)
MCV: 85.4 fL (ref 78.0–100.0)
PLATELETS: 205 10*3/uL (ref 150–400)
RBC: 3.98 MIL/uL — ABNORMAL LOW (ref 4.22–5.81)
RDW: 16.5 % — ABNORMAL HIGH (ref 11.5–15.5)
WBC: 14.3 10*3/uL — AB (ref 4.0–10.5)

## 2014-01-13 LAB — TROPONIN I: Troponin I: <0.3 ng/mL (ref <0.30)

## 2014-01-13 LAB — CBC WITH DIFFERENTIAL/PLATELET
Basophils Absolute: 0 10*3/uL (ref 0.0–0.1)
Basophils Relative: 0 % (ref 0–1)
Eosinophils Absolute: 0 10*3/uL (ref 0.0–0.7)
Eosinophils Relative: 0 % (ref 0–5)
HEMATOCRIT: 38.4 % — AB (ref 39.0–52.0)
Hemoglobin: 12.9 g/dL — ABNORMAL LOW (ref 13.0–17.0)
LYMPHS PCT: 2 % — AB (ref 12–46)
Lymphs Abs: 0.4 10*3/uL — ABNORMAL LOW (ref 0.7–4.0)
MCH: 28.5 pg (ref 26.0–34.0)
MCHC: 33.6 g/dL (ref 30.0–36.0)
MCV: 84.8 fL (ref 78.0–100.0)
MONO ABS: 0.6 10*3/uL (ref 0.1–1.0)
Monocytes Relative: 3 % (ref 3–12)
Neutro Abs: 15.7 10*3/uL — ABNORMAL HIGH (ref 1.7–7.7)
Neutrophils Relative %: 94 % — ABNORMAL HIGH (ref 43–77)
Platelets: 244 10*3/uL (ref 150–400)
RBC: 4.53 MIL/uL (ref 4.22–5.81)
RDW: 16.3 % — ABNORMAL HIGH (ref 11.5–15.5)
WBC: 16.6 10*3/uL — AB (ref 4.0–10.5)

## 2014-01-13 LAB — GLUCOSE, CAPILLARY
GLUCOSE-CAPILLARY: 265 mg/dL — AB (ref 70–99)
Glucose-Capillary: 201 mg/dL — ABNORMAL HIGH (ref 70–99)
Glucose-Capillary: 234 mg/dL — ABNORMAL HIGH (ref 70–99)

## 2014-01-13 LAB — URINALYSIS, ROUTINE W REFLEX MICROSCOPIC
BILIRUBIN URINE: NEGATIVE
Glucose, UA: 250 mg/dL — AB
KETONES UR: NEGATIVE mg/dL
Leukocytes, UA: NEGATIVE
Nitrite: NEGATIVE
PH: 6 (ref 5.0–8.0)
Protein, ur: 100 mg/dL — AB
Specific Gravity, Urine: 1.021 (ref 1.005–1.030)
Urobilinogen, UA: 0.2 mg/dL (ref 0.0–1.0)

## 2014-01-13 LAB — PROTIME-INR
INR: 1.19 (ref 0.00–1.49)
PROTHROMBIN TIME: 14.8 s (ref 11.6–15.2)

## 2014-01-13 LAB — BASIC METABOLIC PANEL
BUN: 23 mg/dL (ref 6–23)
CO2: 21 meq/L (ref 19–32)
CREATININE: 1.53 mg/dL — AB (ref 0.50–1.35)
Calcium: 9.4 mg/dL (ref 8.4–10.5)
Chloride: 101 mEq/L (ref 96–112)
GFR calc Af Amer: 50 mL/min — ABNORMAL LOW (ref >90)
GFR calc non Af Amer: 43 mL/min — ABNORMAL LOW (ref >90)
Glucose, Bld: 185 mg/dL — ABNORMAL HIGH (ref 70–99)
Potassium: 4.9 mEq/L (ref 3.7–5.3)
Sodium: 136 mEq/L — ABNORMAL LOW (ref 137–147)

## 2014-01-13 LAB — URINE MICROSCOPIC-ADD ON

## 2014-01-13 LAB — CG4 I-STAT (LACTIC ACID): LACTIC ACID, VENOUS: 1.33 mmol/L (ref 0.5–2.2)

## 2014-01-13 LAB — SEDIMENTATION RATE: SED RATE: 30 mm/h — AB (ref 0–16)

## 2014-01-13 LAB — C-REACTIVE PROTEIN: CRP: 7.1 mg/dL — ABNORMAL HIGH (ref <0.60)

## 2014-01-13 LAB — MAGNESIUM: Magnesium: 1.7 mg/dL (ref 1.5–2.5)

## 2014-01-13 MED ORDER — HEPARIN SODIUM (PORCINE) 5000 UNIT/ML IJ SOLN
5000.0000 [IU] | Freq: Three times a day (TID) | INTRAMUSCULAR | Status: DC
  Administered 2014-01-13 – 2014-01-14 (×4): 5000 [IU] via SUBCUTANEOUS
  Filled 2014-01-13 (×7): qty 1

## 2014-01-13 MED ORDER — SODIUM CHLORIDE 0.9 % IJ SOLN: 3.0000 mL | Freq: Two times a day (BID) | INTRAMUSCULAR | Status: DC

## 2014-01-13 MED ORDER — ACETAMINOPHEN 650 MG RE SUPP: 650.0000 mg | Freq: Four times a day (QID) | RECTAL | Status: DC | PRN

## 2014-01-13 MED ORDER — ONDANSETRON HCL 4 MG PO TABS: 4.0000 mg | ORAL_TABLET | Freq: Four times a day (QID) | ORAL | Status: DC | PRN

## 2014-01-13 MED ORDER — SALINE SPRAY 0.65 % NA SOLN
1.0000 | NASAL | Status: DC | PRN
  Filled 2014-01-13: qty 44

## 2014-01-13 MED ORDER — INSULIN ASPART 100 UNIT/ML ~~LOC~~ SOLN
0.0000 [IU] | SUBCUTANEOUS | Status: DC
Start: 2014-01-14 — End: 2014-01-14
  Administered 2014-01-14: 11 [IU] via SUBCUTANEOUS
  Administered 2014-01-14: 7 [IU] via SUBCUTANEOUS

## 2014-01-13 MED ORDER — ACETAMINOPHEN 325 MG PO TABS: 650.0000 mg | ORAL_TABLET | Freq: Four times a day (QID) | ORAL | Status: DC | PRN

## 2014-01-13 MED ORDER — METRONIDAZOLE IN NACL 5-0.79 MG/ML-% IV SOLN
500.0000 mg | Freq: Once | INTRAVENOUS | Status: DC
  Filled 2014-01-13: qty 100

## 2014-01-13 MED ORDER — ASPIRIN 81 MG PO CHEW
81.0000 mg | CHEWABLE_TABLET | Freq: Every day | ORAL | Status: DC
  Administered 2014-01-13 – 2014-01-14 (×2): 81 mg via ORAL
  Filled 2014-01-13 (×2): qty 1

## 2014-01-13 MED ORDER — INSULIN ASPART 100 UNIT/ML ~~LOC~~ SOLN: 0.0000 [IU] | Freq: Every day | SUBCUTANEOUS | Status: DC

## 2014-01-13 MED ORDER — VANCOMYCIN HCL IN DEXTROSE 1-5 GM/200ML-% IV SOLN
1000.0000 mg | Freq: Once | INTRAVENOUS | Status: AC
  Administered 2014-01-13: 1000 mg via INTRAVENOUS
  Filled 2014-01-13: qty 200

## 2014-01-13 MED ORDER — TRAMADOL HCL 50 MG PO TABS
50.0000 mg | ORAL_TABLET | Freq: Four times a day (QID) | ORAL | Status: DC | PRN
  Administered 2014-01-13: 50 mg via ORAL
  Filled 2014-01-13: qty 1

## 2014-01-13 MED ORDER — ZOLPIDEM TARTRATE 5 MG PO TABS
5.0000 mg | ORAL_TABLET | Freq: Once | ORAL | Status: AC
  Administered 2014-01-13: 5 mg via ORAL
  Filled 2014-01-13: qty 1

## 2014-01-13 MED ORDER — DEXTROSE 5 % IV SOLN
2.0000 g | Freq: Two times a day (BID) | INTRAVENOUS | Status: DC
  Filled 2014-01-13 (×2): qty 2

## 2014-01-13 MED ORDER — FOLIC ACID 1 MG PO TABS
1.0000 mg | ORAL_TABLET | Freq: Every day | ORAL | Status: DC
  Administered 2014-01-13 – 2014-01-14 (×2): 1 mg via ORAL
  Filled 2014-01-13 (×2): qty 1

## 2014-01-13 MED ORDER — FENTANYL 50 MCG/HR TD PT72
50.0000 ug | MEDICATED_PATCH | TRANSDERMAL | Status: DC
  Administered 2014-01-13: 50 ug via TRANSDERMAL
  Filled 2014-01-13: qty 1

## 2014-01-13 MED ORDER — INSULIN ASPART 100 UNIT/ML ~~LOC~~ SOLN
0.0000 [IU] | Freq: Three times a day (TID) | SUBCUTANEOUS | Status: DC
  Administered 2014-01-13 (×2): 5 [IU] via SUBCUTANEOUS
  Administered 2014-01-13: 8 [IU] via SUBCUTANEOUS

## 2014-01-13 MED ORDER — SODIUM CHLORIDE 0.9 % IV BOLUS (SEPSIS)
1000.0000 mL | Freq: Once | INTRAVENOUS | Status: AC
  Administered 2014-01-13: 1000 mL via INTRAVENOUS

## 2014-01-13 MED ORDER — CLOPIDOGREL BISULFATE 75 MG PO TABS
75.0000 mg | ORAL_TABLET | Freq: Every day | ORAL | Status: DC
  Administered 2014-01-13 – 2014-01-14 (×2): 75 mg via ORAL
  Filled 2014-01-13 (×3): qty 1

## 2014-01-13 MED ORDER — METOPROLOL TARTRATE 25 MG PO TABS
25.0000 mg | ORAL_TABLET | Freq: Two times a day (BID) | ORAL | Status: DC
  Administered 2014-01-13 – 2014-01-14 (×3): 25 mg via ORAL
  Filled 2014-01-13 (×4): qty 1

## 2014-01-13 MED ORDER — HYDROMORPHONE HCL PF 1 MG/ML IJ SOLN: 0.5000 mg | INTRAMUSCULAR | Status: DC | PRN

## 2014-01-13 MED ORDER — DEXTROSE 5 % IV SOLN: 1.0000 g | Freq: Once | INTRAVENOUS | Status: DC

## 2014-01-13 MED ORDER — MICONAZOLE NITRATE POWD
Freq: Three times a day (TID) | Status: DC
  Administered 2014-01-14: 10:00:00 via TOPICAL

## 2014-01-13 MED ORDER — INSULIN GLARGINE 100 UNIT/ML ~~LOC~~ SOLN: 35.0000 [IU] | Freq: Two times a day (BID) | SUBCUTANEOUS | Status: DC

## 2014-01-13 MED ORDER — AMLODIPINE BESYLATE 5 MG PO TABS
5.0000 mg | ORAL_TABLET | Freq: Every day | ORAL | Status: DC
  Administered 2014-01-13 – 2014-01-14 (×2): 5 mg via ORAL
  Filled 2014-01-13 (×2): qty 1

## 2014-01-13 MED ORDER — DEXTROSE 5 % IV SOLN
1.0000 g | Freq: Three times a day (TID) | INTRAVENOUS | Status: DC
  Filled 2014-01-13 (×2): qty 1

## 2014-01-13 MED ORDER — INSULIN GLARGINE 100 UNIT/ML ~~LOC~~ SOLN
40.0000 [IU] | Freq: Every day | SUBCUTANEOUS | Status: DC
  Administered 2014-01-13 – 2014-01-14 (×2): 40 [IU] via SUBCUTANEOUS
  Filled 2014-01-13 (×2): qty 0.4

## 2014-01-13 MED ORDER — VANCOMYCIN HCL 10 G IV SOLR
1500.0000 mg | Freq: Once | INTRAVENOUS | Status: DC
  Filled 2014-01-13: qty 1500

## 2014-01-13 MED ORDER — INSULIN GLARGINE 100 UNIT/ML ~~LOC~~ SOLN
45.0000 [IU] | Freq: Every day | SUBCUTANEOUS | Status: DC
  Administered 2014-01-13: 45 [IU] via SUBCUTANEOUS
  Filled 2014-01-13 (×2): qty 0.45

## 2014-01-13 MED ORDER — ONDANSETRON HCL 4 MG/2ML IJ SOLN: 4.0000 mg | Freq: Four times a day (QID) | INTRAMUSCULAR | Status: DC | PRN

## 2014-01-13 NOTE — ED Provider Notes (Signed)
CSN: [id number]     Arrival date & time 01/12/14  2218 History   First MD Initiated Contact with Patient 01/12/14 2306     Chief Complaint  Patient presents with  . Fever  . Nausea     (Consider location/radiation/quality/duration/timing/severity/associated sxs/prior Treatment) HPI Comments: 78 y.o. male with medical history of coronary artery disease status post CABG peripheral vascular disease her to stenosis history of femoropopliteal bypass, rheumatoid arthritis on methotrexate and Humira, peripheral neuropathy, CVA, diabetes, diabetic foot ulcer, chronic kidney disease comes in to the ED with cc of fever, generalized weakness, nausea. Pt's wife reports that patient started having bilateral knee and wrist pain this afternoon, after his visit to the Wound care clinic - where he had normal vital signs, and a normal exam of the right chronic foot wound. Pt had some nausea - no emesis as wel. Wife took a temp in the evening, and it was 100.5 - so she decided to bring him to the ER. Pt has no nausea at this time. He reports not taking any pain meds at home. Pt denies any chills, diophoresis, chest pain, dib, cough, uti like sx. Pt requested that i NOT check the wound, as it was seen by wound specialist, looked great at that time and appropriately dressed. He has no increased leg pain/ foot pain.  Patient is a 78 y.o. male presenting with fever. The history is provided by the patient.  Fever Associated symptoms: no chest pain, no cough, no dysuria and no rash     Past Medical History  Diagnosis Date  . History of open heart surgery   . Diabetes mellitus without complication   . Hx of CABG 01/13/2014  . PVD (peripheral vascular disease) 01/13/2014  . H/O diabetic foot ulcer 01/13/2014  . Adalimumab (Humira) long-term use 01/13/2014  . Rheumatoid arthritis(714.0) 01/13/2014  . Chronic pain syndrome 01/13/2014  . Peripheral neuropathy 01/13/2014  . Carotid stenosis 01/13/2014  . Dyslipidemia  01/13/2014   Past Surgical History  Procedure Laterality Date  . Cholecystectomy    . Coronary artery bypass graft    . Femoral-popliteal bypass graft    . Skin graft     No family history on file. History  Substance Use Topics  . Smoking status: Never Smoker   . Smokeless tobacco: Never Used  . Alcohol Use: No    Review of Systems  Constitutional: Positive for fever. Negative for activity change and appetite change.  Respiratory: Negative for cough and shortness of breath.   Cardiovascular: Negative for chest pain.  Gastrointestinal: Negative for abdominal pain.  Genitourinary: Negative for dysuria.  Musculoskeletal: Positive for arthralgias.  Skin: Positive for wound. Negative for rash.  Allergic/Immunologic: Positive for immunocompromised state.  Hematological: Does not bruise/bleed easily.  All other systems reviewed and are negative.      Allergies  Review of patient's allergies indicates no known allergies.  Home Medications  No current outpatient prescriptions on file. BP 137/61  Pulse 68  Temp(Src) 97.9 F (36.6 C) (Oral)  Resp 24  Ht 6\' 3"  (1.905 m)  Wt 249 lb 5.4 oz (113.1 kg)  BMI 31.17 kg/m2  SpO2 99% Physical Exam  Nursing note and vitals reviewed. Constitutional: He is oriented to person, place, and time. He appears well-developed.  HENT:  Head: Normocephalic and atraumatic.  Eyes: Conjunctivae and EOM are normal. Pupils are equal, round, and reactive to light.  Neck: Normal range of motion. Neck supple.  Cardiovascular: Regular rhythm.   Pulmonary/Chest: Effort  normal and breath sounds normal.  Abdominal: Soft. Bowel sounds are normal. He exhibits no distension. There is no tenderness. There is no rebound and no guarding.  Musculoskeletal:  Bilateral knee exam reveals symmetric appearing knee, with ability to flex and extend over the knees. Pt has same amount of warmth to touch in both of his legs. There is a little hyperpigmentation on the RLE  - which patient states is unchanged. Wrist and elbow exams are normal.   Neurological: He is alert and oriented to person, place, and time.  Skin: Skin is warm.    ED Course  Procedures (including critical care time) Labs Review Labs Reviewed  CBC WITH DIFFERENTIAL - Abnormal; Notable for the following:    WBC 16.6 (*)    Hemoglobin 12.9 (*)    HCT 38.4 (*)    RDW 16.3 (*)    Neutrophils Relative % 94 (*)    Neutro Abs 15.7 (*)    Lymphocytes Relative 2 (*)    Lymphs Abs 0.4 (*)    All other components within normal limits  BASIC METABOLIC PANEL - Abnormal; Notable for the following:    Sodium 136 (*)    Glucose, Bld 185 (*)    Creatinine, Ser 1.53 (*)    GFR calc non Af Amer 43 (*)    GFR calc Af Amer 50 (*)    All other components within normal limits  URINALYSIS, ROUTINE W REFLEX MICROSCOPIC - Abnormal; Notable for the following:    Glucose, UA 250 (*)    Hgb urine dipstick TRACE (*)    Protein, ur 100 (*)    All other components within normal limits  COMPREHENSIVE METABOLIC PANEL - Abnormal; Notable for the following:    Glucose, Bld 237 (*)    BUN 24 (*)    Creatinine, Ser 1.59 (*)    Total Protein 5.6 (*)    Albumin 2.9 (*)    GFR calc non Af Amer 41 (*)    GFR calc Af Amer 47 (*)    All other components within normal limits  CBC - Abnormal; Notable for the following:    WBC 14.3 (*)    RBC 3.98 (*)    Hemoglobin 11.0 (*)    HCT 34.0 (*)    RDW 16.5 (*)    All other components within normal limits  SEDIMENTATION RATE - Abnormal; Notable for the following:    Sed Rate 30 (*)    All other components within normal limits  URINE CULTURE  CULTURE, BLOOD (ROUTINE X 2)  CULTURE, BLOOD (ROUTINE X 2)  CLOSTRIDIUM DIFFICILE BY PCR  CULTURE, EXPECTORATED SPUTUM-ASSESSMENT  MAGNESIUM  TROPONIN I  URINE MICROSCOPIC-ADD ON  PROTIME-INR  INFLUENZA PANEL BY PCR (TYPE A & B, H1N1)  C-REACTIVE PROTEIN  CG4 I-STAT (LACTIC ACID)   Imaging Review Dg Chest 2  View  01/13/2014   CLINICAL DATA:  Chest pain and short of breath.  EXAM: CHEST  2 VIEW  COMPARISON:  DG CHEST 2 VIEW dated 11/09/2013; DG CHEST 2V dated 08/11/2013  FINDINGS: Cardiopericardial silhouette is upper limits of normal for projection. Chronic fullness of the hila. Interstitial prominence is present diffusely with Kerley B lines at the periphery of the right base, compatible with interstitial pulmonary edema. No focal consolidation. The fissures appears relatively thickened compared to prior examinations, again supporting interstitial pulmonary edema and mild CHF.  IMPRESSION: Constellation of findings compatible with mild CHF.   Electronically Signed   By: Andreas Newport  M.D.   On: 01/13/2014 02:17      MDM   Final diagnoses:  Weakness  Malaise  Leukocytosis         Date: 01/13/2014  Rate: 77  Rhythm: normal sinus rhythm  QRS Axis: normal  Intervals: normal  ST/T Wave abnormalities: normal  Conduction Disutrbances: none  Narrative Interpretation: unremarkable   Pt comes in with cc of bilateral knee pain, wrist pain. Pt also has generalized weakness. Subjective fevers at home. My ortho exam is not impressive - patient has been ambulating, ROM of the knee is stable, and i doubt patient has septic joint. Basic labs were ordered - and patient is noted to have leukocytosis. I reassessed the patient, and added blood cultures, and CXR. Still not impressed with knee exam - although early septic joint is possible. Bilateral septic arthritis is certainly highly unlikely.  Pt, with his fever at home, leukocytosis and immunocompromised status (DM, methotrexate, humira) - we will start him on vanc and ceftriaxone for broad spectrum coverage. Hospitalist to admit.  Patient didn't want [place] to examine the foot wound -as it was just cleared by a wound speciialist a mere 12 hours ago.     Derwood Kaplan, MD 01/13/14 [phone]

## 2014-01-13 NOTE — H&P (Signed)
[facility] History and Physical  Patient: Nathaniel Serrano  ZOX:[id number]  DOB: [birth year]  DOS: the patient was seen and examined on 01/13/2014 PCP: Malka So., MD  Chief Complaint: Fever and weakness  HPI: Nathaniel Serrano is a 78 y.o. male with Past medical history of coronary artery disease status post CABG peripheral vascular disease her to stenosis history of femoropopliteal bypass, rheumatoid arthritis on methotrexate and Humira, peripheral neuropathy, CVA, diabetes, diabetic foot ulcer, chronic kidney disease. The patient is coming from home. The patient mentions that since last one week he has been having generalized weakness and fatigue. He is not ambulating significantly as per his baseline.Today he started having complaints of severe pain in his joints primarily of bilateral knee and this was associated with fever 100.9 at home.  Pt denies any headache, cough, chest pain, palpitation, shortness of breath, orthopnea, PND, nausea, vomiting, abdominal pain, constipation, active bleeding, burning urination, dizziness, pedal edema,  focal neurological deficit. He also had 2 episodes of loose watery bowel movements without any blood. He has history of rheumatoid arthritis and has been on methotrexate chronically 2 weeks ago he was also started on Humira and has completed 2 treatments so far. 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 He has been following with wound care Center and has the skin graft and currently his infection is healing well as per his wife. He has chronic nasal congestion, chronic rash in his groin.  Review of Systems: as mentioned in the history of present illness.  A Comprehensive review of the other  systems is negative.  Past Medical History  Diagnosis Date  . History of open heart surgery   . Diabetes mellitus without complication   . Hx of CABG 01/13/2014  . PVD (peripheral vascular disease) 01/13/2014  . H/O diabetic foot ulcer 01/13/2014  . Adalimumab (Humira) long-term use 01/13/2014  . Rheumatoid arthritis(714.0) 01/13/2014  . Chronic pain syndrome 01/13/2014  . Peripheral neuropathy 01/13/2014  . Carotid stenosis 01/13/2014  . Dyslipidemia 01/13/2014   Past Surgical History  Procedure Laterality Date  . Cholecystectomy    . Coronary artery bypass graft    . Femoral-popliteal bypass graft    . Skin graft     Social History:  reports that he has never smoked. He has never used smokeless tobacco. He reports that he does not drink alcohol or use illicit drugs. Independent for most of his  ADL.  No Known Allergies  No family history on file.  Prior to Admission medications   Medication Sig Start Date End Date Taking? Authorizing Provider  amLODipine (NORVASC) 5 MG tablet Take 5 mg by mouth daily.  08/09/13  Yes Historical Provider, MD  aspirin 81 MG tablet Take 81 mg by mouth daily.   Yes Historical Provider, MD  clopidogrel (PLAVIX) 75 MG tablet Take 75 mg by mouth daily with breakfast.   Yes Historical Provider, MD  fentaNYL (DURAGESIC - DOSED MCG/HR) 50 MCG/HR Place 50 mcg onto the skin every 3 (three) days.   Yes Historical Provider, MD  folic acid (FOLVITE) 1 MG tablet Take 1 tablet by mouth daily. 11/01/13  Yes Historical Provider, MD  HUMIRA PEN 40 MG/0.8ML injection Inject 40 mg into the muscle every 14 (fourteen) days.  01/09/14  Yes Historical Provider, MD  HYDROmorphone (DILAUDID) 2 MG tablet Take by mouth every 4 (four) hours as needed for severe pain.   Yes Historical Provider, MD  insulin glargine (LANTUS) 100 UNIT/ML injection Inject 35 Units into the skin 2 (two) times daily. Said dosages just change takes 4units at night but doesn't remember am dosage   Yes Historical  Provider, MD  methotrexate (50 MG/ML) 1 G injection Inject into the vein once a week.    Yes Historical Provider, MD  metoprolol tartrate (LOPRESSOR) 25 MG tablet Take 25 mg by mouth 2 (two) times daily.   Yes Historical Provider, MD  NOVOLOG FLEXPEN 100 UNIT/ML FlexPen Inject 12-30 Units into the skin 3 (three) times daily with meals.  12/16/13  Yes Historical Provider, MD  polyethylene glycol (MIRALAX / GLYCOLAX) packet Take 17 g by mouth daily.   Yes Historical Provider, MD  traMADol (ULTRAM) 50 MG tablet Take 50 mg by mouth every 6 (six) hours as needed for moderate pain.   Yes Historical Provider, MD    Physical Exam: Filed Vitals:   01/12/14 2257 01/12/14 2332 01/12/14 2356 01/12/14 2359  BP:  154/58 148/58 142/66  Pulse:  75 77 84  Temp:      TempSrc:      Resp:   18 18  Height:      Weight:      SpO2: 97%  96% 96%    General: Alert, Awake and Oriented to Time, Place and Person. Appear in mild distress Eyes: PERRL ENT: Oral Mucosa clear moist. Neck: no JVD, no lymphadenopathy  Cardiovascular: S1 and S2 Present, no Murmur, Peripheral Pulses Present Respiratory: Bilateral Air entry equal and Decreased, Clear to Auscultation,  no Crackles,no wheezes Abdomen: Bowel Sound Present, Soft and Non tender Skin: no Rash, chronic venous stasis changes  Extremities: Trace foot Pedal edema, no calf tenderness, bilateral knee warmth and limited ROM Neurologic: Grossly Unremarkable.  Labs on Admission:  CBC:  Recent Labs Lab 01/12/14 2315  WBC 16.6*  NEUTROABS 15.7*  HGB 12.9*  HCT 38.4*  MCV 84.8  PLT 244    CMP     Component Value Date/Time   NA 136* 01/12/2014 2315   K 4.9 01/12/2014 2315   CL 101 01/12/2014 2315   CO2 21 01/12/2014 2315   GLUCOSE 185* 01/12/2014 2315   BUN 23 01/12/2014 2315   CREATININE 1.53* 01/12/2014 2315   CALCIUM 9.4 01/12/2014 2315   PROT 6.9 11/09/2013 1330   ALBUMIN 3.2* 11/09/2013 1330   AST 13 11/09/2013 1330   ALT 8 11/09/2013 1330    ALKPHOS 111 11/09/2013 1330   BILITOT 0.5 11/09/2013 1330   GFRNONAA 43* 01/12/2014 2315   GFRAA 50* 01/12/2014 2315    No results found for this basename: LIPASE, AMYLASE,  in the last 168 hours No results found for this basename: AMMONIA,  in the last 168 hours   Recent Labs Lab 01/12/14 2315  TROPONINI <0.30   BNP (last 3 results) No results found for this basename: PROBNP,  in the last 8760 hours  Radiological Exams on Admission: Dg Chest 2 View  01/13/2014   CLINICAL DATA:  Chest pain and short of breath.  EXAM: CHEST  2 VIEW  COMPARISON:  DG CHEST 2 VIEW dated 11/09/2013; DG CHEST 2V dated 08/11/2013  FINDINGS: Cardiopericardial silhouette is upper limits of normal for projection. Chronic fullness of the hila. Interstitial prominence is present diffusely with Charyl Dancer B lines at the periphery of the right base, compatible with interstitial pulmonary  edema. No focal consolidation. The fissures appears relatively thickened compared to prior examinations, again supporting interstitial pulmonary edema and mild CHF.  IMPRESSION: Constellation of findings compatible with mild CHF.   Electronically Signed   By: Andreas Newport M.D.   On: 01/13/2014 02:17    Assessment/Plan Principal Problem:   Fever Active Problems:   Leukocytosis   Hx of CABG   PVD (peripheral vascular disease)   H/O diabetic foot ulcer   Physical deconditioning   Adalimumab (Humira) long-term use   Rheumatoid arthritis(714.0)   Knee pain   Chronic pain syndrome   Peripheral neuropathy   Proliferative diabetic retinopathy of both eyes   Carotid stenosis   Dyslipidemia   1. Fever And patient is presenting with fever, leukocytosis, bilateral arthralgia that has been ongoing since today and generalized weakness that has been ongoing since last one week. His chest x-ray is clear, urine is clear he does not appear to have any external rash or lymphadenopathy. With this he has been on Humira since last 2 weeks and  methrotrexate chronically and has been being treated for a non-healing diabetic foot ulcer by wound care center since last 1 year. With this the pt will be admitted for observation and he will be given 1 dose of empiric antibiotics based on his prior culture from [facility]. I will also get sputum culture, blood culture, urine culture and C diff. Will check also infuenza PCR. Recheck x-rays of his knee I have reviewed the records from [facility] and records from his rheumatologist are pending at present.  2. Diabetes mellitus Continue Lantus place on sliding scale  3. Coronary artery disease, peripheral vascular disease, CABG Continue aspirin and Plavix, Lopressor.  4. Rheumatoid arthritis chronic pain management At present holding methotrexate and Humira. Patient is on multiple pain medications. Continue fentanyl, trauma to placed on IV Dilaudid as needed.  5. Hypertension Blood pressure well controlled continue Norvasc.  DVT Prophylaxis: subcutaneous Heparin Nutrition: Cardiac and diabetic  Code Status: Full  Family Communication: Wife was present at bedside, opportunity was given to ask question and all questions were answered satisfactorily at the time of interview. Disposition: Admitted to observation in med-surge unit.  Author: Lynden Oxford, MD [facility] Pager: [phone] 01/13/2014, 3:42 AM    If 7PM-7AM, please contact night-coverage [email or web address] Password [email or web address]   Addendum: I did obtain ID consultation regarding him and they recommended to observe and obtain cultures and if there is worsening of his hemodynamic status or fever then place him on antibiotics. Also he has not got any skin test prior to humira, more likely he has quantiferron gold done prior to humira. At present since he does not have any infiltrate or cough no respiratory isolation needed per ID. Will hold of on Antibiotics at present. He has received 1 dose of vancomycin in ED.  We will get work up done.  Marishka Rentfrow

## 2014-01-13 NOTE — Progress Notes (Signed)
[facility] PROGRESS NOTE  Nathaniel Serrano OQH:[id number] DOB: 1936/04/24 DOA: 01/12/2014 PCP: Malka So., MD  Assessment/Plan:  Fever -Afebrile -Monitor overnight -Received one dose of vancomycin in ED. No further antibiotics indicated, she develops fever or leukocytosis worsens  Diabetes type 2 -Continue patient on Lantus -Continue resistant SSI  CAD -Asymptomatic -Continue aspirin, Plavix, Lopressor  Rheumatoid arthritis -Will hold methotrexate and marrow -Continue canal -IV Dilaudid PRN  HTN -Continue home medication    Code Status: Full Family Communication: No family available Disposition Plan: Resolution of leukocytosis   Consultants:     Procedures: Left knee x-ray 01/13/2014 Mild degenerative changes with small suprapatellar knee joint  Effusion.  Right knee x-ray 01/13/2014 No acute fracture or dislocation is noted. Diffuse vascular  calcifications are noted. Distal superficial femoral/ popliteal  arterial stents are noted. Postsurgical changes are noted medially.  Mild calcification of the menisci is noted.  CXR 01/13/2014 Constellation of findings compatible with mild CHF.     Antibiotics:      HPI/Subjective: 78 y.o. WM PMHx CAD, S/P CABG peripheral vascular disease her to stenosis history of femoropopliteal bypass, rheumatoid arthritis on methotrexate and Humira, peripheral neuropathy, CVA, diabetes, diabetic foot ulcer, chronic kidney disease.  The patient is coming from home.  The patient mentions that since last one week he has been having generalized weakness and fatigue. He is not ambulating significantly as per his baseline.Today he started having complaints of severe pain in his joints primarily of bilateral knee and this was associated with fever 100.9 at home.  Pt denies any headache, cough, chest pain, palpitation, shortness of breath, orthopnea, PND, nausea, vomiting, abdominal pain, constipation, active bleeding, burning  urination, dizziness, pedal edema, focal neurological deficit.  He also had 2 episodes of loose watery bowel movements without any blood. He has history of rheumatoid arthritis and has been on methotrexate chronically 2 weeks ago he was also started on Humira and has completed 2 treatments so far.  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 He has been following with wound care Center and has the skin graft and currently his infection is healing well as per his wife.  He has chronic nasal congestion, chronic rash in his groin. 2/13 patient states unsure why he was admitted, negative CP/SOB   Objective: Filed Vitals:   01/12/14 2332 01/12/14 2356 01/12/14 2359 01/13/14 0400  BP: 154/58 148/58 142/66 137/61  Pulse: 75 77 84 68  Temp:    97.9 F (36.6 C)  TempSrc:    Oral  Resp:  18 18 24   Height:    6\' 3"  (1.905 m)  Weight:    113.1 kg (249 lb 5.4 oz)  SpO2:  96% 96% 99%    Intake/Output Summary (Last 24 hours) at 01/13/14 1053 Last data filed at 01/13/14 0854  Gross per 24 hour  Intake    240 ml  Output    500 ml  Net   -260 ml   Filed Weights   01/12/14 2219 01/13/14 0400  Weight: 111.131 kg (245 lb) 113.1 kg (249 lb 5.4 oz)    Exam:   General:  A./O. x4, NAD   Cardiovascular: Regular rhythm and rate, negative murmurs rubs gallops, DP/PT pulse one plus bilateral  Respiratory: Clear to auscultation bilateral  Abdomen: Soft,  nontender, plus bowel sound  Musculoskeletal: Right lateral foot mildly erythematous multiple ulcers with clean dressing covering, negative sign of infection   Data Reviewed: Basic Metabolic Panel:  Recent Labs Lab 01/12/14 2315 01/13/14 0520  NA 136* 137  K 4.9 4.9  CL 101 103  CO2 21  21  GLUCOSE 185* 237*  BUN 23 24*  CREATININE 1.53* 1.59*  CALCIUM 9.4 8.4  MG 1.7  --    Liver Function Tests:  Recent Labs Lab 01/13/14 0520  AST 13  ALT 9  ALKPHOS 91  BILITOT 0.6  PROT 5.6*  ALBUMIN 2.9*   No results found for this basename: LIPASE, AMYLASE,  in the last 168 hours No results found for this basename: AMMONIA,  in the last 168 hours CBC:  Recent Labs Lab 01/12/14 2315 01/13/14 0520  WBC 16.6* 14.3*  NEUTROABS 15.7*  --   HGB 12.9* 11.0*  HCT 38.4* 34.0*  MCV 84.8 85.4  PLT 244 205   Cardiac Enzymes:  Recent Labs Lab 01/12/14 2315  TROPONINI <0.30   BNP (last 3 results) No results found for this basename: PROBNP,  in the last 8760 hours CBG:  Recent Labs Lab 01/13/14 0731  GLUCAP 201*    No results found for this or any previous visit (from the past 240 hour(s)).   Studies: Dg Chest 2 View  01/13/2014   CLINICAL DATA:  Chest pain and short of breath.  EXAM: CHEST  2 VIEW  COMPARISON:  DG CHEST 2 VIEW dated 11/09/2013; DG CHEST 2V dated 08/11/2013  FINDINGS: Cardiopericardial silhouette is upper limits of normal for projection. Chronic fullness of the hila. Interstitial prominence is present diffusely with Kerley B lines at the periphery of the right base, compatible with interstitial pulmonary edema. No focal consolidation. The fissures appears relatively thickened compared to prior examinations, again supporting interstitial pulmonary edema and mild CHF.  IMPRESSION: Constellation of findings compatible with mild CHF.   Electronically Signed   By: Andreas Newport M.D.   On: 01/13/2014 02:17   Dg Knee 1-2 Views Left  01/13/2014   CLINICAL DATA:  Bilateral knee pain/ swelling, arthritis  EXAM: LEFT KNEE - 1-2 VIEW  COMPARISON:  None.  FINDINGS: No fracture or dislocation is seen.  Mild joint space narrowing in the lateral and patellofemoral compartments.  Small suprapatellar knee joint effusion.  Vascular calcifications.  IMPRESSION: Mild  degenerative changes with small suprapatellar knee joint effusion.   Electronically Signed   By: Charline Bills M.D.   On: 01/13/2014 10:02   Dg Knee 1-2 Views Right  01/13/2014   CLINICAL DATA:  Right knee pain  EXAM: RIGHT KNEE - 1-2 VIEW  COMPARISON:  None.  FINDINGS: No acute fracture or dislocation is noted. Diffuse vascular calcifications are noted. Distal superficial femoral/ popliteal arterial stents are noted. Postsurgical changes are noted medially. Mild calcification of the menisci is noted.  IMPRESSION: Chronic changes without acute abnormality.   Electronically Signed   By: Alcide Clever M.D.   On: 01/13/2014 10:00    Scheduled Meds: . amLODipine  5 mg Oral Daily  . aspirin  81 mg Oral Daily  . clopidogrel  75 mg Oral Q breakfast  . fentaNYL  50 mcg Transdermal Q72H  . folic acid  1 mg Oral Daily  . heparin  5,000 Units Subcutaneous 3 times per day  . insulin aspart  0-15 Units Subcutaneous TID WC  . insulin aspart  0-5 Units Subcutaneous QHS  . insulin glargine  40 Units Subcutaneous Daily   And  . insulin glargine  45 Units Subcutaneous QHS  . metoprolol tartrate  25 mg Oral BID  . miconazole nitrate   Topical TID  . sodium chloride  3 mL Intravenous Q12H   Continuous Infusions:   Principal Problem:   Fever Active Problems:   Leukocytosis   Hx of CABG   PVD (peripheral vascular disease)   H/O diabetic foot ulcer   Physical deconditioning   Adalimumab (Humira) long-term use   Rheumatoid arthritis(714.0)   Knee pain   Chronic pain syndrome   Peripheral neuropathy   Proliferative diabetic retinopathy of both eyes   Carotid stenosis   Dyslipidemia    Time spent: 40 min   Alizee Maple, J  [facility] Pager [phone]. If 7PM-7AM, please contact night-coverage at [email or web address], password [email or web address] 01/13/2014, 10:53 AM  LOS: 1 day

## 2014-01-13 NOTE — Care Management Note (Signed)
   CARE MANAGEMENT NOTE 01/13/2014  Patient:  JASIAH, ELSEN   Account Number:  [id number]  Date Initiated:  01/13/2014  Documentation initiated by:  [place] President  Subjective/Objective Assessment:   78 yo male admitted with fever. PCP: Malka So., MD     Action/Plan:   Home when stable   Anticipated DC Date:     Anticipated DC Plan:        DC Planning Services  CM consult      Choice offered to / List presented to:  NA   DME arranged  NA      DME agency  NA     HH arranged  NA      HH agency  NA   Status of service:  In process, will continue to follow Medicare Important Message given?   (If response is "NO", the following Medicare IM given date fields will be blank) Date Medicare IM given:   Date Additional Medicare IM given:    Discharge Disposition:    Per UR Regulation:  Reviewed for med. necessity/level of care/duration of stay  If discussed at Long Length of Stay Meetings, dates discussed:    Comments:  01/13/14 1226 Chasyn Cinque,MSN,RN [phone] Chart reviewed for utilization of services. Pt resides home with spouse. Identified PCP and pharmacy. [facility] No needs identified at this time.

## 2014-01-14 DIAGNOSIS — I739 Peripheral vascular disease, unspecified: Secondary | ICD-10-CM

## 2014-01-14 DIAGNOSIS — E785 Hyperlipidemia, unspecified: Secondary | ICD-10-CM

## 2014-01-14 DIAGNOSIS — R509 Fever, unspecified: Principal | ICD-10-CM

## 2014-01-14 LAB — CBC WITH DIFFERENTIAL/PLATELET
BASOS PCT: 1 % (ref 0–1)
Basophils Absolute: 0 10*3/uL (ref 0.0–0.1)
Eosinophils Absolute: 0.4 10*3/uL (ref 0.0–0.7)
Eosinophils Relative: 7 % — ABNORMAL HIGH (ref 0–5)
HEMATOCRIT: 31.8 % — AB (ref 39.0–52.0)
HEMOGLOBIN: 10.6 g/dL — AB (ref 13.0–17.0)
Lymphocytes Relative: 14 % (ref 12–46)
Lymphs Abs: 0.9 10*3/uL (ref 0.7–4.0)
MCH: 28.2 pg (ref 26.0–34.0)
MCHC: 33.3 g/dL (ref 30.0–36.0)
MCV: 84.6 fL (ref 78.0–100.0)
MONO ABS: 0.4 10*3/uL (ref 0.1–1.0)
MONOS PCT: 6 % (ref 3–12)
Neutro Abs: 4.7 10*3/uL (ref 1.7–7.7)
Neutrophils Relative %: 73 % (ref 43–77)
Platelets: 185 10*3/uL (ref 150–400)
RBC: 3.76 MIL/uL — ABNORMAL LOW (ref 4.22–5.81)
RDW: 16.4 % — ABNORMAL HIGH (ref 11.5–15.5)
WBC: 6.4 10*3/uL (ref 4.0–10.5)

## 2014-01-14 LAB — COMPREHENSIVE METABOLIC PANEL
ALT: 7 U/L (ref 0–53)
AST: 12 U/L (ref 0–37)
Albumin: 2.7 g/dL — ABNORMAL LOW (ref 3.5–5.2)
Alkaline Phosphatase: 79 U/L (ref 39–117)
BUN: 23 mg/dL (ref 6–23)
CALCIUM: 8.7 mg/dL (ref 8.4–10.5)
CO2: 24 mEq/L (ref 19–32)
Chloride: 104 mEq/L (ref 96–112)
Creatinine, Ser: 1.5 mg/dL — ABNORMAL HIGH (ref 0.50–1.35)
GFR calc non Af Amer: 44 mL/min — ABNORMAL LOW (ref >90)
GFR, EST AFRICAN AMERICAN: 51 mL/min — AB (ref >90)
GLUCOSE: 72 mg/dL (ref 70–99)
Potassium: 4.2 mEq/L (ref 3.7–5.3)
SODIUM: 138 meq/L (ref 137–147)
TOTAL PROTEIN: 5.5 g/dL — AB (ref 6.0–8.3)
Total Bilirubin: 0.7 mg/dL (ref 0.3–1.2)

## 2014-01-14 LAB — URINE CULTURE: Colony Count: 4000

## 2014-01-14 LAB — GLUCOSE, CAPILLARY
GLUCOSE-CAPILLARY: 233 mg/dL — AB (ref 70–99)
GLUCOSE-CAPILLARY: 86 mg/dL (ref 70–99)
GLUCOSE-CAPILLARY: 91 mg/dL (ref 70–99)
Glucose-Capillary: 274 mg/dL — ABNORMAL HIGH (ref 70–99)

## 2014-01-14 LAB — PRO B NATRIURETIC PEPTIDE: Pro B Natriuretic peptide (BNP): 1829 pg/mL — ABNORMAL HIGH (ref 0–450)

## 2014-01-14 LAB — MAGNESIUM: Magnesium: 1.7 mg/dL (ref 1.5–2.5)

## 2014-01-14 NOTE — Discharge Summary (Signed)
Physician Discharge Summary  Nathaniel Serrano QAS:[id number] DOB: Oct 04, 1936 DOA: 01/12/2014  PCP: Malka So., MD  Admit date: 01/12/2014 Discharge date: 01/14/2014  Time spent: 40 minute   Recommendations for Outpatient Follow-up:  Fever  -Afebrile overnight -leukocytosis resolved -Followup with rheumatologist   Diabetes type 2  -Discharge on home medication -Followup with PCP    CAD  -Asymptomatic  -Continue aspirin, Plavix, Lopressor   Rheumatoid arthritis  -Discharge on his home medication. Patient to followup with his rheumatologist  HTN  -Continue home medication          Discharge Diagnoses:  Principal Problem:   Fever Active Problems:   Leukocytosis   Hx of CABG   PVD (peripheral vascular disease)   H/O diabetic foot ulcer   Physical deconditioning   Adalimumab (Humira) long-term use   Rheumatoid arthritis(714.0)   Knee pain   Chronic pain syndrome   Peripheral neuropathy   Proliferative diabetic retinopathy of both eyes   Carotid stenosis   Dyslipidemia   Discharge Condition: Stable  Diet recommendation: American diabetic Association  Filed Weights   01/12/14 2219 01/13/14 0400 01/14/14 0415  Weight: 111.131 kg (245 lb) 113.1 kg (249 lb 5.4 oz) 112.991 kg (249 lb 1.6 oz)    History of present illness:  78 y.o. WM PMHx CAD, S/P CABG peripheral vascular disease her to stenosis history of femoropopliteal bypass, rheumatoid arthritis on methotrexate and Humira, peripheral neuropathy, CVA, diabetes, diabetic foot ulcer, chronic kidney disease.  The patient is coming from home.  The patient mentions that since last one week he has been having generalized weakness and fatigue. He is not ambulating significantly as per his baseline.Today he started having complaints of severe pain in his joints primarily of bilateral knee and this was associated with fever 100.9 at home.  Pt denies any headache, cough, chest pain, palpitation, shortness of breath,  orthopnea, PND, nausea, vomiting, abdominal pain, constipation, active bleeding, burning urination, dizziness, pedal edema, focal neurological deficit.  He also had 2 episodes of loose watery bowel movements without any blood. He has history of rheumatoid arthritis and has been on methotrexate chronically 2 weeks ago he was also started on Humira and has completed 2 treatments so far.  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 He has been following with wound care Center and has the skin graft and currently his infection is healing well as per his wife.  He has chronic nasal congestion, chronic rash in his groin. 2/13 patient states unsure why he was admitted, negative CP/SOB 2/14 no acute events overnight ready for   Consultants:    Procedures:  Left knee x-ray 01/13/2014  Mild degenerative changes with small suprapatellar knee joint  Effusion.  Right knee x-ray 01/13/2014  No acute fracture or dislocation is noted. Diffuse vascular  calcifications are noted. Distal superficial femoral/ popliteal  arterial stents are noted. Postsurgical changes are noted medially.  Mild calcification of the menisci is noted.  CXR 01/13/2014  Constellation of findings compatible with mild CHF.     Antibiotics:    Discharge Exam: Filed Vitals:   01/13/14 1337 01/13/14 2024 01/14/14 0413 01/14/14 0415  BP: 145/55 136/60 138/62   Pulse: 59 61 60   Temp: 97.4 F (36.3 C) 97.9 F (36.6 C) 98.2 F (36.8 C)  TempSrc: Oral Oral Oral   Resp: 20 20 18    Height:      Weight:    112.991 kg (249 lb 1.6 oz)  SpO2: 98% 97% 98%    General: A./O. x4, NAD  Cardiovascular: Regular rhythm and rate, negative murmurs rubs gallops, DP/PT pulse one plus  bilateral  Respiratory: Clear to auscultation bilateral  Abdomen: Soft, nontender, plus bowel sound  Musculoskeletal: Right lateral foot mildly erythematous multiple ulcers with clean dressing covering, negative sign of infection   Discharge Instructions     Medication List    ASK your doctor about these medications       amLODipine 5 MG tablet  Commonly known as:  NORVASC  Take 5 mg by mouth daily.     aspirin 81 MG tablet  Take 81 mg by mouth daily.     clopidogrel 75 MG tablet  Commonly known as:  PLAVIX  Take 75 mg by mouth daily with breakfast.     fentaNYL 50 MCG/HR  Commonly known as:  DURAGESIC - dosed mcg/hr  Place 50 mcg onto the skin every 3 (three) days.     folic acid 1 MG tablet  Commonly known as:  FOLVITE  Take 1 tablet by mouth daily.     HUMIRA PEN 40 MG/0.8ML injection  Generic drug:  adalimumab  Inject 40 mg into the muscle every 14 (fourteen) days.     HYDROmorphone 2 MG tablet  Commonly known as:  DILAUDID  Take by mouth every 4 (four) hours as needed for severe pain.     insulin glargine 100 UNIT/ML injection  Commonly known as:  LANTUS  Inject 40-45 Units into the skin 2 (two) times daily. 40 units in the morning, 45 units in the evening     insulin lispro 100 UNIT/ML injection  Commonly known as:  HUMALOG  Inject 16-26 Units into the skin 3 (three) times daily before meals. 16 units with breakfast, 24 with lunch, 26 with supper. Adjust as needed with sliding scale     methotrexate 1 G injection  Commonly known as:  50 mg/ml  Inject into the vein once a week.     metoprolol tartrate 25 MG tablet  Commonly known as:  LOPRESSOR  Take 25 mg by mouth 2 (two) times daily.     polyethylene glycol packet  Commonly known as:  MIRALAX / GLYCOLAX  Take 17 g by mouth daily.     traMADol 50 MG tablet  Commonly known as:  ULTRAM  Take 50 mg by mouth every 6 (six) hours as needed for moderate pain.       No Known Allergies    The results  of significant diagnostics from this hospitalization (including imaging, microbiology, ancillary and laboratory) are listed below for reference.    Significant Diagnostic Studies: Dg Chest 2 View  01/13/2014   CLINICAL DATA:  Chest pain and short of breath.  EXAM: CHEST  2 VIEW  COMPARISON:  DG CHEST 2 VIEW dated 11/09/2013; DG CHEST 2V dated 08/11/2013  FINDINGS: Cardiopericardial silhouette is upper limits of normal for projection. Chronic fullness of the hila. Interstitial prominence is present diffusely with Kerley B lines at the periphery of the right base, compatible with interstitial pulmonary edema. No focal consolidation. The fissures appears relatively thickened compared to prior examinations, again supporting interstitial pulmonary edema and mild CHF.  IMPRESSION: Constellation of findings compatible with mild CHF.   Electronically Signed   By: 10/11/2013.D.  On: 01/13/2014 02:17   Dg Knee 1-2 Views Left  01/13/2014   CLINICAL DATA:  Bilateral knee pain/ swelling, arthritis  EXAM: LEFT KNEE - 1-2 VIEW  COMPARISON:  None.  FINDINGS: No fracture or dislocation is seen.  Mild joint space narrowing in the lateral and patellofemoral compartments.  Small suprapatellar knee joint effusion.  Vascular calcifications.  IMPRESSION: Mild degenerative changes with small suprapatellar knee joint effusion.   Electronically Signed   By: Charline Bills M.D.   On: 01/13/2014 10:02   Dg Knee 1-2 Views Right  01/13/2014   CLINICAL DATA:  Right knee pain  EXAM: RIGHT KNEE - 1-2 VIEW  COMPARISON:  None.  FINDINGS: No acute fracture or dislocation is noted. Diffuse vascular calcifications are noted. Distal superficial femoral/ popliteal arterial stents are noted. Postsurgical changes are noted medially. Mild calcification of the menisci is noted.  IMPRESSION: Chronic changes without acute abnormality.   Electronically Signed   By: Alcide Clever M.D.   On: 01/13/2014 10:00    Microbiology: Recent Results  (from the past 240 hour(s))  URINE CULTURE     Status: None   Collection Time    01/13/14 12:24 AM      Result Value Ref Range Status   Specimen Description URINE, CLEAN CATCH   Final   Special Requests Immunocompromised   Final   Culture  Setup Time     Final   Value: 01/13/2014 09:22     Performed at [facility] Count     Final   Value: 4,000 COLONIES/ML     Performed at [facility]   Culture     Final   Value: INSIGNIFICANT GROWTH     Performed at [facility]   Report Status 01/14/2014 FINAL   Final     Labs: Basic Metabolic Panel:  Recent Labs Lab 01/12/14 2315 01/13/14 0520 01/14/14 0525  NA 136* 137 138  K 4.9 4.9 4.2  CL 101 103 104  CO2 21 21 24   GLUCOSE 185* 237* 72  BUN 23 24* 23  CREATININE 1.53* 1.59* 1.50*  CALCIUM 9.4 8.4 8.7  MG 1.7  --  1.7   Liver Function Tests:  Recent Labs Lab 01/13/14 0520 01/14/14 0525  AST 13 12  ALT 9 7  ALKPHOS 91 79  BILITOT 0.6 0.7  PROT 5.6* 5.5*  ALBUMIN 2.9* 2.7*   No results found for this basename: LIPASE, AMYLASE,  in the last 168 hours No results found for this basename: AMMONIA,  in the last 168 hours CBC:  Recent Labs Lab 01/12/14 2315 01/13/14 0520 01/14/14 0525  WBC 16.6* 14.3* 6.4  NEUTROABS 15.7*  --  4.7  HGB 12.9* 11.0* 10.6*  HCT 38.4* 34.0* 31.8*  MCV 84.8 85.4 84.6  PLT 244 205 185   Cardiac Enzymes:  Recent Labs Lab 01/12/14 2315  TROPONINI <0.30   BNP: BNP (last 3 results)  Recent Labs  01/14/14 0525  PROBNP 1829.0*   CBG:  Recent Labs Lab 01/13/14 1646 01/14/14 01/14/14 0403 01/14/14 0729 01/14/14 1200  GLUCAP 265* 233* 91 86 274*       Signed:  Carolyne Littles, MD [facility] [phone] pager

## 2014-01-19 LAB — CULTURE, BLOOD (ROUTINE X 2)
CULTURE: NO GROWTH
Culture: NO GROWTH

## 2014-02-02 ENCOUNTER — Encounter (HOSPITAL_BASED_OUTPATIENT_CLINIC_OR_DEPARTMENT_OTHER): Payer: Medicare Other | Attending: Internal Medicine

## 2014-02-02 DIAGNOSIS — E1169 Type 2 diabetes mellitus with other specified complication: Secondary | ICD-10-CM | POA: Insufficient documentation

## 2014-02-02 DIAGNOSIS — I739 Peripheral vascular disease, unspecified: Secondary | ICD-10-CM | POA: Insufficient documentation

## 2014-02-02 DIAGNOSIS — L97509 Non-pressure chronic ulcer of other part of unspecified foot with unspecified severity: Secondary | ICD-10-CM | POA: Insufficient documentation

## 2014-02-02 DIAGNOSIS — X58XXXA Exposure to other specified factors, initial encounter: Secondary | ICD-10-CM | POA: Insufficient documentation

## 2014-02-02 DIAGNOSIS — S91309A Unspecified open wound, unspecified foot, initial encounter: Secondary | ICD-10-CM | POA: Insufficient documentation

## 2014-03-02 ENCOUNTER — Encounter (HOSPITAL_BASED_OUTPATIENT_CLINIC_OR_DEPARTMENT_OTHER): Payer: Medicare Other | Attending: Internal Medicine

## 2014-03-02 DIAGNOSIS — S91309A Unspecified open wound, unspecified foot, initial encounter: Secondary | ICD-10-CM | POA: Insufficient documentation

## 2014-03-02 DIAGNOSIS — X58XXXA Exposure to other specified factors, initial encounter: Secondary | ICD-10-CM | POA: Insufficient documentation

## 2014-04-20 ENCOUNTER — Encounter (HOSPITAL_COMMUNITY): Payer: Self-pay | Admitting: Emergency Medicine

## 2014-04-20 ENCOUNTER — Emergency Department (HOSPITAL_COMMUNITY): Payer: Medicare Other

## 2014-04-20 ENCOUNTER — Emergency Department (HOSPITAL_COMMUNITY)
Admission: EM | Admit: 2014-04-20 | Discharge: 2014-04-20 | Discharge status: Against Medical Advice | Payer: Medicare Other | Attending: Emergency Medicine | Admitting: Emergency Medicine

## 2014-04-20 ENCOUNTER — Other Ambulatory Visit (HOSPITAL_COMMUNITY): Payer: Medicare Other

## 2014-04-20 DIAGNOSIS — R5381 Other malaise: Secondary | ICD-10-CM | POA: Insufficient documentation

## 2014-04-20 DIAGNOSIS — G894 Chronic pain syndrome: Secondary | ICD-10-CM | POA: Insufficient documentation

## 2014-04-20 DIAGNOSIS — R143 Flatulence: Secondary | ICD-10-CM

## 2014-04-20 DIAGNOSIS — Z872 Personal history of diseases of the skin and subcutaneous tissue: Secondary | ICD-10-CM | POA: Insufficient documentation

## 2014-04-20 DIAGNOSIS — Z7902 Long term (current) use of antithrombotics/antiplatelets: Secondary | ICD-10-CM | POA: Insufficient documentation

## 2014-04-20 DIAGNOSIS — E119 Type 2 diabetes mellitus without complications: Secondary | ICD-10-CM | POA: Insufficient documentation

## 2014-04-20 DIAGNOSIS — Z79899 Other long term (current) drug therapy: Secondary | ICD-10-CM | POA: Insufficient documentation

## 2014-04-20 DIAGNOSIS — Z87891 Personal history of nicotine dependence: Secondary | ICD-10-CM | POA: Insufficient documentation

## 2014-04-20 DIAGNOSIS — M069 Rheumatoid arthritis, unspecified: Secondary | ICD-10-CM | POA: Insufficient documentation

## 2014-04-20 DIAGNOSIS — I2510 Atherosclerotic heart disease of native coronary artery without angina pectoris: Secondary | ICD-10-CM | POA: Insufficient documentation

## 2014-04-20 DIAGNOSIS — R142 Eructation: Secondary | ICD-10-CM

## 2014-04-20 DIAGNOSIS — Z951 Presence of aortocoronary bypass graft: Secondary | ICD-10-CM | POA: Insufficient documentation

## 2014-04-20 DIAGNOSIS — R141 Gas pain: Secondary | ICD-10-CM | POA: Insufficient documentation

## 2014-04-20 DIAGNOSIS — Z7982 Long term (current) use of aspirin: Secondary | ICD-10-CM | POA: Insufficient documentation

## 2014-04-20 DIAGNOSIS — R5383 Other fatigue: Principal | ICD-10-CM

## 2014-04-20 DIAGNOSIS — R531 Weakness: Secondary | ICD-10-CM

## 2014-04-20 DIAGNOSIS — Z794 Long term (current) use of insulin: Secondary | ICD-10-CM | POA: Insufficient documentation

## 2014-04-20 LAB — CBC WITH DIFFERENTIAL/PLATELET
BASOS ABS: 0.1 10*3/uL (ref 0.0–0.1)
BASOS PCT: 1 % (ref 0–1)
Eosinophils Absolute: 0.1 10*3/uL (ref 0.0–0.7)
Eosinophils Relative: 2 % (ref 0–5)
HCT: 40 % (ref 39.0–52.0)
Hemoglobin: 13.9 g/dL (ref 13.0–17.0)
Lymphocytes Relative: 10 % — ABNORMAL LOW (ref 12–46)
Lymphs Abs: 0.5 10*3/uL — ABNORMAL LOW (ref 0.7–4.0)
MCH: 29.8 pg (ref 26.0–34.0)
MCHC: 34.8 g/dL (ref 30.0–36.0)
MCV: 85.8 fL (ref 78.0–100.0)
Monocytes Absolute: 0.6 10*3/uL (ref 0.1–1.0)
Monocytes Relative: 11 % (ref 3–12)
NEUTROS PCT: 76 % (ref 43–77)
Neutro Abs: 4.1 10*3/uL (ref 1.7–7.7)
PLATELETS: 203 10*3/uL (ref 150–400)
RBC: 4.66 MIL/uL (ref 4.22–5.81)
RDW: 14.9 % (ref 11.5–15.5)
WBC: 5.4 10*3/uL (ref 4.0–10.5)

## 2014-04-20 LAB — BASIC METABOLIC PANEL
BUN: 28 mg/dL — ABNORMAL HIGH (ref 6–23)
CO2: 24 mEq/L (ref 19–32)
Calcium: 9.2 mg/dL (ref 8.4–10.5)
Chloride: 103 mEq/L (ref 96–112)
Creatinine, Ser: 1.44 mg/dL — ABNORMAL HIGH (ref 0.50–1.35)
GFR, EST AFRICAN AMERICAN: 53 mL/min — AB (ref >90)
GFR, EST NON AFRICAN AMERICAN: 46 mL/min — AB (ref >90)
Glucose, Bld: 275 mg/dL — ABNORMAL HIGH (ref 70–99)
POTASSIUM: 5.4 meq/L — AB (ref 3.7–5.3)
Sodium: 136 mEq/L — ABNORMAL LOW (ref 137–147)

## 2014-04-20 LAB — URINE MICROSCOPIC-ADD ON

## 2014-04-20 LAB — URINALYSIS, ROUTINE W REFLEX MICROSCOPIC
Bilirubin Urine: NEGATIVE
Glucose, UA: >1000 mg/dL — AB
Hgb urine dipstick: NEGATIVE
Ketones, ur: NEGATIVE mg/dL
Leukocytes, UA: NEGATIVE
NITRITE: NEGATIVE
PROTEIN: 100 mg/dL — AB
Specific Gravity, Urine: 1.024 (ref 1.005–1.030)
Urobilinogen, UA: 1 mg/dL (ref 0.0–1.0)
pH: 5 (ref 5.0–8.0)

## 2014-04-20 LAB — I-STAT TROPONIN, ED: Troponin i, poc: 0 ng/mL (ref 0.00–0.08)

## 2014-04-20 LAB — POTASSIUM: Potassium: 5.2 mEq/L (ref 3.7–5.3)

## 2014-04-20 NOTE — ED Notes (Signed)
Pt A&OX4, ambulatory at discharge with steady gait, NAD noted. Pt verbalizing understanding of leaving AMA and will see his PCP.

## 2014-04-20 NOTE — ED Notes (Signed)
EMS - Pt coming from [facility] in [place] by MD with possible "new onset Afib".  Pt has generalized weakness all over, no nausea, no vomiting, no chest pain.

## 2014-04-20 NOTE — Discharge Instructions (Signed)
Please read and follow all provided instructions.  Your diagnoses today include:  1. Generalized weakness    Tests performed today include:  Blood counts and electrolytes - normal for you, blood sugar is a little high and kidneys are a little weak  Test for heart muscle damage - normal, no heart attack  Urine test - no infection  EKG - no signs of abnormal heartbeat here  CT head - no strokes or other problems  Vital signs. See below for your results today.   Medications prescribed:   None  Take any prescribed medications only as directed.  Home care instructions:  Follow any educational materials contained in this packet.  Follow-up instructions: Please follow-up with your primary care provider as soon as possible for further evaluation of your symptoms.   Return instructions:   Please return to the Emergency Department if you experience worsening symptoms.  Return if you have weakness in your arms or legs, slurred speech, trouble walking or talking, confusion, or trouble with your balance.  Return with chest pain, shortness of breath, or fever  Please return if you have any other emergent concerns.  Additional Information:  Your vital signs today were: BP 157/72   Pulse 60   Temp(Src) 97.6 F (36.4 C) (Oral)   Resp 15   Ht 6\' 3"  (1.905 m)   Wt 250 lb (113.399 kg)   BMI 31.25 kg/m2   SpO2 97% If your blood pressure (BP) was elevated above 135/85 this visit, please have this repeated by your doctor within one month. --------------

## 2014-04-20 NOTE — ED Provider Notes (Signed)
CSN: [id number]     Arrival date & time 04/20/14  1501 History   First MD Initiated Contact with Patient 04/20/14 1518     Chief Complaint  Patient presents with  . Weakness     (Consider location/radiation/quality/duration/timing/severity/associated sxs/prior Treatment) HPI Comments: Patient with history of coronary artery disease status post CABG (1992), femoropopliteal bypass, rheumatoid arthritis on Enbrel (starting 2 days ago after switching from Humira), peripheral neuropathy, CVA, diabetes, diabetic foot ulcer, chronic kidney disease -- presents with c/o generalized weakness. Patient states that he feels generally weak or short period of time approximately once a week. Patient states that symptoms have been occurring for approximately 2 days which is unusual for him. Patient went to see his primary care physician today. He had an EKG done which showed possible atrial fibrillation. He has not had irregular heartbeat in the past. He denies fever, URI symptoms, chest pain, cough, shortness of breath, abdominal pain, change in urination such as dysuria, change in stools. Patient complains of bloating which is not unusual for him. He denies lower extremity swelling. The onset of this condition was acute. The course is constant. Aggravating factors: none. Alleviating factors: none.    Patient is a 78 y.o. male presenting with weakness. The history is provided by the patient.  Weakness Associated symptoms include fatigue and weakness (no focal weakness). Pertinent negatives include no abdominal pain, chest pain, coughing, fever, headaches, myalgias, nausea, rash, sore throat or vomiting.    Past Medical History  Diagnosis Date  . History of open heart surgery   . Diabetes mellitus without complication   . Hx of CABG 01/13/2014  . PVD (peripheral vascular disease) 01/13/2014  . H/O diabetic foot ulcer 01/13/2014  . Adalimumab (Humira) long-term use 01/13/2014  . Rheumatoid arthritis(714.0)  01/13/2014  . Chronic pain syndrome 01/13/2014  . Peripheral neuropathy 01/13/2014  . Carotid stenosis 01/13/2014  . Dyslipidemia 01/13/2014   Past Surgical History  Procedure Laterality Date  . Cholecystectomy    . Coronary artery bypass graft    . Femoral-popliteal bypass graft    . Skin graft     No family history on file. History  Substance Use Topics  . Smoking status: Former Games developer  . Smokeless tobacco: Never Used  . Alcohol Use: No    Review of Systems  Constitutional: Positive for fatigue. Negative for fever.  HENT: Negative for rhinorrhea and sore throat.   Eyes: Negative for redness.  Respiratory: Negative for cough, shortness of breath and wheezing.   Cardiovascular: Negative for chest pain.  Gastrointestinal: Positive for abdominal distention (bloating). Negative for nausea, vomiting, abdominal pain and diarrhea.  Genitourinary: Negative for dysuria and frequency.  Musculoskeletal: Negative for myalgias.  Skin: Negative for rash.  Neurological: Positive for weakness (no focal weakness). Negative for headaches.  Psychiatric/Behavioral: The patient is not nervous/anxious.       Allergies  Review of patient's allergies indicates no known allergies.  Home Medications   Prior to Admission medications   Medication Sig Start Date End Date Taking? Authorizing Provider  amLODipine (NORVASC) 5 MG tablet Take 5 mg by mouth daily.  08/09/13  Yes Historical Provider, MD  aspirin EC 81 MG tablet Take 81 mg by mouth daily. Take by mouth.   Yes Historical Provider, MD  clopidogrel (PLAVIX) 75 MG tablet Take 75 mg by mouth daily with breakfast.   Yes Historical Provider, MD  etanercept (ENBREL SURECLICK) 50 MG/ML injection Inject 50 mg into the skin once a week. On Tuesdays  04/07/14  Yes Historical Provider, MD  fentaNYL (DURAGESIC - DOSED MCG/HR) 50 MCG/HR Place 50 mcg onto the skin every 3 (three) days.   Yes Historical Provider, MD  folic acid (FOLVITE) 1 MG tablet Take 1 mg by  mouth daily.  11/01/13  Yes Historical Provider, MD  metoprolol tartrate (LOPRESSOR) 25 MG tablet Take 25 mg by mouth 2 (two) times daily.   Yes Historical Provider, MD  naproxen sodium (ALEVE) 220 MG tablet Take 440 mg by mouth at bedtime as needed (pain).   Yes Historical Provider, MD  polyethylene glycol (MIRALAX / GLYCOLAX) packet Take 17 g by mouth daily.   Yes Historical Provider, MD  Insulin Glargine (LANTUS SOLOSTAR) 100 UNIT/ML Solostar Pen Inject into the skin.    Historical Provider, MD  insulin glargine (LANTUS) 100 UNIT/ML injection Inject 40-45 Units into the skin 2 (two) times daily. 40 units in the morning, 45 units in the evening    Historical Provider, MD  insulin lispro (HUMALOG KWIKPEN) 100 UNIT/ML KiwkPen Inject into the skin.    Historical Provider, MD  insulin lispro (HUMALOG) 100 UNIT/ML injection Inject 16-26 Units into the skin 3 (three) times daily before meals. 16 units with breakfast, 24 with lunch, 26 with supper. Adjust as needed with sliding scale    Historical Provider, MD  Methotrexate Sodium, PF, 250 MG/10ML SOLN Inject 17.5 mg into the skin. 03/29/14   Historical Provider, MD  Methotrexate Sodium, PF, 250 MG/10ML SOLN  03/29/14   Historical Provider, MD  OxyCODONE (OXYCONTIN) 10 mg T12A 12 hr tablet Take 10 mg by mouth.    Historical Provider, MD   BP 158/64  Pulse 69  Temp(Src) 97.7 F (36.5 C) (Oral)  Resp 17  Ht 6\' 3"  (1.905 m)  Wt 250 lb (113.399 kg)  BMI 31.25 kg/m2  SpO2 97%  Physical Exam  Nursing note and vitals reviewed. Constitutional: He appears well-developed and well-nourished.  HENT:  Head: Normocephalic and atraumatic.  Mouth/Throat: Oropharynx is clear and moist.  Eyes: Conjunctivae are normal. Pupils are equal, round, and reactive to light. Right eye exhibits no discharge. Left eye exhibits no discharge.  Neck: Normal range of motion. Neck supple.  Cardiovascular: Normal rate and normal heart sounds.  An irregularly irregular rhythm  present.  No murmur heard. Pulmonary/Chest: Effort normal and breath sounds normal. No respiratory distress. He has no wheezes. He has no rales.  Healed sternotomy scar.   Abdominal: Soft. He exhibits distension (mild). Bowel sounds are increased. There is no tenderness. There is no rebound and no guarding.  Musculoskeletal: He exhibits no edema and no tenderness.  No LE edema  Neurological: He is alert.  Skin: Skin is warm and dry.  Psychiatric: He has a normal mood and affect.    ED Course  Procedures (including critical care time) Labs Review Labs Reviewed - No data to display  Imaging Review No results found.   EKG Interpretation   Date/Time:  Thursday Apr 20 2014 15:06:42 EDT Ventricular Rate:  67 PR Interval:  174 QRS Duration: 106 QT Interval:  420 QTC Calculation: 443 R Axis:   17 Text Interpretation:  Sinus rhythm No significant change was found  Confirmed by 05-29-1989  MD, STEPHEN [phone]) on 04/20/2014 4:26:05 PM      3:34 PM Patient seen and examined. Work-up initiated. D/w Dr. 04/22/2014. PCP and EMS EKGs are of poor quality and cannot be interpreted definitively.   Vital signs reviewed and are as follows: Filed Vitals:  04/20/14 1509  BP: 158/64  Pulse: 69  Temp: 97.7 F (36.5 C)  Resp: 17   4:30 PM  EKG @ 15:06 shows NSR EKG @ 16:20 shows NSR, unchanged from previous.   Pt seen by Dr. Manus Gunning who notes L UE weakness on exam. CT head ordered. Patient has had previous stroke with no residual deficits.   7:13 PM MRI was ordered due to neg CT. When pt informed he states 'I will die before I get into one of those machines'. Apparently the last time he had an MRI it flared up his rheumatoid arthritis. I discussed the benefits of MRI with the patient, most importantly we would know if he had a stroke. He continues to refuse and would rather f/u with PCP. Discussed risks of not having MRI, mainly stroke and related morbidity and mortality for that.   Will  recheck potassium.   8:33 PM Potassium upper normal range. He continues to refuse MRI. He states that he will call tomorrow for an appointment with his PCP. This was discussed with family at bedside. Patient is to return with any worsening symptoms or if he changes his mind. Patient appears to have capacity to make decisions regarding his health.   Patient counseled to return if they have weakness in their arms or legs, slurred speech, trouble walking or talking, confusion, trouble with their balance, or if they have any other concerns. Patient verbalizes understanding and agrees with plan.   MDM   Final diagnoses:  Generalized weakness   Patient with vague generalized weakness, sent into emergency department with concern of A. fib. There appears to be P waves/NSR on EKG sent from PCP office, however EKG is of poor quality and it is tough to tell definitively. 2 EKGs here do not show any future fibrillation but showed normal sinus rhythm.  Workup in emergency department is essentially normal except for high normal potassium and hyperglycemia which is baseline. Kidney function at baseline. Question mild left upper extremity weakness. CT negative, patient refuses MRI to r/o new stroke. Patient wishes to followup with PCP.     Renne Crigler, PA-C 04/20/14 2037

## 2014-04-21 NOTE — ED Provider Notes (Signed)
Medical screening examination/treatment/procedure(s) were conducted as a shared visit with non-physician practitioner(s) and myself.  I personally evaluated the patient during the encounter.  From PCP's office with generalized weakness and concern for new onset atrial fibrillation.  Sinus arrhythmia on ED EKG.  Review of PCP and EMS EKGs show P waves and sinus arrhythmia and do not appear to be afib. No fever. Recently switched biologic for RA. Exam is benign other than LUE weakness which patient was unaware of. CT head negative. Cr at baseline.  K improved on recheck MRI recommended which patient refuses.   EKG Interpretation   Date/Time:  Thursday Apr 20 2014 15:06:42 EDT Ventricular Rate:  67 PR Interval:  174 QRS Duration: 106 QT Interval:  420 QTC Calculation: 443 R Axis:   17 Text Interpretation:  Sinus rhythm No significant change was found  Confirmed by Manus Gunning  MD, Ashely Joshua [id number]) on 04/20/2014 4:26:05 PM       Glynn Octave, MD 04/21/14 1130

## 2014-06-12 ENCOUNTER — Encounter (HOSPITAL_BASED_OUTPATIENT_CLINIC_OR_DEPARTMENT_OTHER): Payer: Medicare Other | Attending: Plastic Surgery

## 2014-06-12 DIAGNOSIS — E1169 Type 2 diabetes mellitus with other specified complication: Secondary | ICD-10-CM | POA: Insufficient documentation

## 2014-06-12 DIAGNOSIS — L97509 Non-pressure chronic ulcer of other part of unspecified foot with unspecified severity: Secondary | ICD-10-CM | POA: Diagnosis not present

## 2014-06-22 DIAGNOSIS — L97509 Non-pressure chronic ulcer of other part of unspecified foot with unspecified severity: Secondary | ICD-10-CM | POA: Diagnosis not present

## 2014-06-22 DIAGNOSIS — E1169 Type 2 diabetes mellitus with other specified complication: Secondary | ICD-10-CM | POA: Diagnosis not present

## 2014-06-29 DIAGNOSIS — E1169 Type 2 diabetes mellitus with other specified complication: Secondary | ICD-10-CM | POA: Diagnosis not present

## 2014-06-29 DIAGNOSIS — L97509 Non-pressure chronic ulcer of other part of unspecified foot with unspecified severity: Secondary | ICD-10-CM | POA: Diagnosis not present

## 2014-07-06 ENCOUNTER — Encounter (HOSPITAL_BASED_OUTPATIENT_CLINIC_OR_DEPARTMENT_OTHER): Payer: Medicare Other | Attending: Internal Medicine

## 2014-07-06 DIAGNOSIS — E1169 Type 2 diabetes mellitus with other specified complication: Secondary | ICD-10-CM | POA: Diagnosis not present

## 2014-07-06 DIAGNOSIS — L97509 Non-pressure chronic ulcer of other part of unspecified foot with unspecified severity: Secondary | ICD-10-CM | POA: Diagnosis not present

## 2014-07-13 DIAGNOSIS — L97509 Non-pressure chronic ulcer of other part of unspecified foot with unspecified severity: Secondary | ICD-10-CM | POA: Diagnosis not present

## 2014-07-13 DIAGNOSIS — E1169 Type 2 diabetes mellitus with other specified complication: Secondary | ICD-10-CM | POA: Diagnosis not present

## 2014-07-20 DIAGNOSIS — L97509 Non-pressure chronic ulcer of other part of unspecified foot with unspecified severity: Secondary | ICD-10-CM | POA: Diagnosis not present

## 2014-07-20 DIAGNOSIS — E1169 Type 2 diabetes mellitus with other specified complication: Secondary | ICD-10-CM | POA: Diagnosis not present

## 2014-07-27 DIAGNOSIS — L97509 Non-pressure chronic ulcer of other part of unspecified foot with unspecified severity: Secondary | ICD-10-CM | POA: Diagnosis not present

## 2014-07-27 DIAGNOSIS — E1169 Type 2 diabetes mellitus with other specified complication: Secondary | ICD-10-CM | POA: Diagnosis not present

## 2015-03-15 ENCOUNTER — Encounter (HOSPITAL_BASED_OUTPATIENT_CLINIC_OR_DEPARTMENT_OTHER): Payer: No Typology Code available for payment source

## 2016-01-31 ENCOUNTER — Encounter (HOSPITAL_BASED_OUTPATIENT_CLINIC_OR_DEPARTMENT_OTHER): Payer: No Typology Code available for payment source

## 2016-04-04 ENCOUNTER — Encounter (HOSPITAL_BASED_OUTPATIENT_CLINIC_OR_DEPARTMENT_OTHER): Payer: Medicare Other | Attending: Internal Medicine

## 2016-04-04 DIAGNOSIS — I890 Lymphedema, not elsewhere classified: Secondary | ICD-10-CM | POA: Insufficient documentation

## 2016-04-04 DIAGNOSIS — I2510 Atherosclerotic heart disease of native coronary artery without angina pectoris: Secondary | ICD-10-CM | POA: Diagnosis not present

## 2016-04-04 DIAGNOSIS — E1140 Type 2 diabetes mellitus with diabetic neuropathy, unspecified: Secondary | ICD-10-CM | POA: Diagnosis not present

## 2016-04-04 DIAGNOSIS — M069 Rheumatoid arthritis, unspecified: Secondary | ICD-10-CM | POA: Insufficient documentation

## 2016-04-04 DIAGNOSIS — Z7982 Long term (current) use of aspirin: Secondary | ICD-10-CM | POA: Insufficient documentation

## 2016-04-04 DIAGNOSIS — L97411 Non-pressure chronic ulcer of right heel and midfoot limited to breakdown of skin: Secondary | ICD-10-CM | POA: Diagnosis not present

## 2016-04-04 DIAGNOSIS — Z79899 Other long term (current) drug therapy: Secondary | ICD-10-CM | POA: Insufficient documentation

## 2016-04-04 DIAGNOSIS — T8131XA Disruption of external operation (surgical) wound, not elsewhere classified, initial encounter: Secondary | ICD-10-CM | POA: Insufficient documentation

## 2016-04-04 DIAGNOSIS — E1151 Type 2 diabetes mellitus with diabetic peripheral angiopathy without gangrene: Secondary | ICD-10-CM | POA: Insufficient documentation

## 2016-04-04 DIAGNOSIS — Z794 Long term (current) use of insulin: Secondary | ICD-10-CM | POA: Diagnosis not present

## 2016-04-04 DIAGNOSIS — L97421 Non-pressure chronic ulcer of left heel and midfoot limited to breakdown of skin: Secondary | ICD-10-CM | POA: Insufficient documentation

## 2016-04-11 DIAGNOSIS — E1151 Type 2 diabetes mellitus with diabetic peripheral angiopathy without gangrene: Secondary | ICD-10-CM | POA: Diagnosis not present

## 2016-04-18 DIAGNOSIS — E1151 Type 2 diabetes mellitus with diabetic peripheral angiopathy without gangrene: Secondary | ICD-10-CM | POA: Diagnosis not present

## 2016-04-25 DIAGNOSIS — E1151 Type 2 diabetes mellitus with diabetic peripheral angiopathy without gangrene: Secondary | ICD-10-CM | POA: Diagnosis not present

## 2016-05-02 ENCOUNTER — Encounter (HOSPITAL_BASED_OUTPATIENT_CLINIC_OR_DEPARTMENT_OTHER): Payer: Medicare Other | Attending: Internal Medicine

## 2016-05-02 DIAGNOSIS — L97511 Non-pressure chronic ulcer of other part of right foot limited to breakdown of skin: Secondary | ICD-10-CM | POA: Insufficient documentation

## 2016-05-02 DIAGNOSIS — L97411 Non-pressure chronic ulcer of right heel and midfoot limited to breakdown of skin: Secondary | ICD-10-CM | POA: Diagnosis not present

## 2016-05-02 DIAGNOSIS — I10 Essential (primary) hypertension: Secondary | ICD-10-CM | POA: Insufficient documentation

## 2016-05-02 DIAGNOSIS — M069 Rheumatoid arthritis, unspecified: Secondary | ICD-10-CM | POA: Insufficient documentation

## 2016-05-02 DIAGNOSIS — E1140 Type 2 diabetes mellitus with diabetic neuropathy, unspecified: Secondary | ICD-10-CM | POA: Diagnosis not present

## 2016-05-02 DIAGNOSIS — I890 Lymphedema, not elsewhere classified: Secondary | ICD-10-CM | POA: Diagnosis not present

## 2016-05-02 DIAGNOSIS — L97421 Non-pressure chronic ulcer of left heel and midfoot limited to breakdown of skin: Secondary | ICD-10-CM | POA: Insufficient documentation

## 2016-05-02 DIAGNOSIS — Z79899 Other long term (current) drug therapy: Secondary | ICD-10-CM | POA: Insufficient documentation

## 2016-05-02 DIAGNOSIS — E11621 Type 2 diabetes mellitus with foot ulcer: Secondary | ICD-10-CM | POA: Diagnosis not present

## 2016-05-02 DIAGNOSIS — Z7982 Long term (current) use of aspirin: Secondary | ICD-10-CM | POA: Diagnosis not present

## 2016-05-02 DIAGNOSIS — G40909 Epilepsy, unspecified, not intractable, without status epilepticus: Secondary | ICD-10-CM | POA: Insufficient documentation

## 2016-05-02 DIAGNOSIS — E1151 Type 2 diabetes mellitus with diabetic peripheral angiopathy without gangrene: Secondary | ICD-10-CM | POA: Diagnosis not present

## 2016-05-02 DIAGNOSIS — I2510 Atherosclerotic heart disease of native coronary artery without angina pectoris: Secondary | ICD-10-CM | POA: Diagnosis not present

## 2016-05-02 DIAGNOSIS — Z794 Long term (current) use of insulin: Secondary | ICD-10-CM | POA: Diagnosis not present

## 2016-05-02 DIAGNOSIS — Z7902 Long term (current) use of antithrombotics/antiplatelets: Secondary | ICD-10-CM | POA: Diagnosis not present

## 2016-05-09 DIAGNOSIS — E11621 Type 2 diabetes mellitus with foot ulcer: Secondary | ICD-10-CM | POA: Diagnosis not present

## 2016-05-16 DIAGNOSIS — E11621 Type 2 diabetes mellitus with foot ulcer: Secondary | ICD-10-CM | POA: Diagnosis not present

## 2016-05-23 DIAGNOSIS — E11621 Type 2 diabetes mellitus with foot ulcer: Secondary | ICD-10-CM | POA: Diagnosis not present

## 2016-05-30 DIAGNOSIS — E11621 Type 2 diabetes mellitus with foot ulcer: Secondary | ICD-10-CM | POA: Diagnosis not present

## 2016-06-06 ENCOUNTER — Encounter (HOSPITAL_BASED_OUTPATIENT_CLINIC_OR_DEPARTMENT_OTHER): Payer: Medicare Other | Attending: Internal Medicine

## 2016-06-06 DIAGNOSIS — I10 Essential (primary) hypertension: Secondary | ICD-10-CM | POA: Insufficient documentation

## 2016-06-06 DIAGNOSIS — E11621 Type 2 diabetes mellitus with foot ulcer: Secondary | ICD-10-CM | POA: Insufficient documentation

## 2016-06-06 DIAGNOSIS — E1151 Type 2 diabetes mellitus with diabetic peripheral angiopathy without gangrene: Secondary | ICD-10-CM | POA: Diagnosis not present

## 2016-06-06 DIAGNOSIS — Z89422 Acquired absence of other left toe(s): Secondary | ICD-10-CM | POA: Insufficient documentation

## 2016-06-06 DIAGNOSIS — L97811 Non-pressure chronic ulcer of other part of right lower leg limited to breakdown of skin: Secondary | ICD-10-CM | POA: Diagnosis not present

## 2016-06-06 DIAGNOSIS — Z794 Long term (current) use of insulin: Secondary | ICD-10-CM | POA: Insufficient documentation

## 2016-06-06 DIAGNOSIS — L97411 Non-pressure chronic ulcer of right heel and midfoot limited to breakdown of skin: Secondary | ICD-10-CM | POA: Diagnosis not present

## 2016-06-06 DIAGNOSIS — I2510 Atherosclerotic heart disease of native coronary artery without angina pectoris: Secondary | ICD-10-CM | POA: Diagnosis not present

## 2016-06-06 DIAGNOSIS — E1140 Type 2 diabetes mellitus with diabetic neuropathy, unspecified: Secondary | ICD-10-CM | POA: Diagnosis not present

## 2016-06-06 DIAGNOSIS — L97421 Non-pressure chronic ulcer of left heel and midfoot limited to breakdown of skin: Secondary | ICD-10-CM | POA: Insufficient documentation

## 2016-06-06 DIAGNOSIS — M1990 Unspecified osteoarthritis, unspecified site: Secondary | ICD-10-CM | POA: Diagnosis not present

## 2016-06-13 DIAGNOSIS — E11621 Type 2 diabetes mellitus with foot ulcer: Secondary | ICD-10-CM | POA: Diagnosis not present

## 2016-06-20 DIAGNOSIS — E11621 Type 2 diabetes mellitus with foot ulcer: Secondary | ICD-10-CM | POA: Diagnosis not present

## 2016-06-27 DIAGNOSIS — E11621 Type 2 diabetes mellitus with foot ulcer: Secondary | ICD-10-CM | POA: Diagnosis not present

## 2016-07-04 ENCOUNTER — Other Ambulatory Visit (HOSPITAL_BASED_OUTPATIENT_CLINIC_OR_DEPARTMENT_OTHER): Payer: Self-pay | Admitting: General Surgery

## 2016-07-04 ENCOUNTER — Encounter (HOSPITAL_BASED_OUTPATIENT_CLINIC_OR_DEPARTMENT_OTHER): Payer: Medicare Other | Attending: Internal Medicine

## 2016-07-04 ENCOUNTER — Ambulatory Visit (HOSPITAL_COMMUNITY)
Admission: RE | Admit: 2016-07-04 | Discharge: 2016-07-04 | Disposition: A | Discharge status: Home / Self Care | Payer: Medicare Other | Source: Ambulatory Visit | Attending: General Surgery | Admitting: General Surgery

## 2016-07-04 DIAGNOSIS — I2510 Atherosclerotic heart disease of native coronary artery without angina pectoris: Secondary | ICD-10-CM | POA: Diagnosis not present

## 2016-07-04 DIAGNOSIS — I10 Essential (primary) hypertension: Secondary | ICD-10-CM | POA: Insufficient documentation

## 2016-07-04 DIAGNOSIS — M85871 Other specified disorders of bone density and structure, right ankle and foot: Secondary | ICD-10-CM | POA: Diagnosis not present

## 2016-07-04 DIAGNOSIS — E1151 Type 2 diabetes mellitus with diabetic peripheral angiopathy without gangrene: Secondary | ICD-10-CM | POA: Diagnosis not present

## 2016-07-04 DIAGNOSIS — G40909 Epilepsy, unspecified, not intractable, without status epilepticus: Secondary | ICD-10-CM | POA: Insufficient documentation

## 2016-07-04 DIAGNOSIS — L97411 Non-pressure chronic ulcer of right heel and midfoot limited to breakdown of skin: Secondary | ICD-10-CM | POA: Insufficient documentation

## 2016-07-04 DIAGNOSIS — I890 Lymphedema, not elsewhere classified: Secondary | ICD-10-CM | POA: Diagnosis not present

## 2016-07-04 DIAGNOSIS — E1140 Type 2 diabetes mellitus with diabetic neuropathy, unspecified: Secondary | ICD-10-CM | POA: Insufficient documentation

## 2016-07-04 DIAGNOSIS — M069 Rheumatoid arthritis, unspecified: Secondary | ICD-10-CM | POA: Insufficient documentation

## 2016-07-04 DIAGNOSIS — L89893 Pressure ulcer of other site, stage 3: Secondary | ICD-10-CM | POA: Diagnosis not present

## 2016-07-04 DIAGNOSIS — E11621 Type 2 diabetes mellitus with foot ulcer: Secondary | ICD-10-CM | POA: Diagnosis not present

## 2016-07-04 DIAGNOSIS — M869 Osteomyelitis, unspecified: Secondary | ICD-10-CM

## 2016-07-04 DIAGNOSIS — Z89421 Acquired absence of other right toe(s): Secondary | ICD-10-CM | POA: Insufficient documentation

## 2016-07-11 DIAGNOSIS — E11621 Type 2 diabetes mellitus with foot ulcer: Secondary | ICD-10-CM | POA: Diagnosis not present

## 2016-07-18 DIAGNOSIS — E11621 Type 2 diabetes mellitus with foot ulcer: Secondary | ICD-10-CM | POA: Diagnosis not present

## 2016-07-25 DIAGNOSIS — E11621 Type 2 diabetes mellitus with foot ulcer: Secondary | ICD-10-CM | POA: Diagnosis not present

## 2016-09-05 ENCOUNTER — Encounter: Payer: Self-pay | Admitting: Podiatry

## 2016-09-05 ENCOUNTER — Ambulatory Visit (INDEPENDENT_AMBULATORY_CARE_PROVIDER_SITE_OTHER): Payer: Medicare Other | Admitting: Podiatry

## 2016-09-05 DIAGNOSIS — B351 Tinea unguium: Secondary | ICD-10-CM

## 2016-09-05 DIAGNOSIS — M79676 Pain in unspecified toe(s): Secondary | ICD-10-CM | POA: Diagnosis not present

## 2016-09-05 DIAGNOSIS — M79604 Pain in right leg: Secondary | ICD-10-CM

## 2016-09-05 DIAGNOSIS — E1140 Type 2 diabetes mellitus with diabetic neuropathy, unspecified: Secondary | ICD-10-CM | POA: Diagnosis not present

## 2016-09-05 DIAGNOSIS — E1149 Type 2 diabetes mellitus with other diabetic neurological complication: Secondary | ICD-10-CM | POA: Diagnosis not present

## 2016-09-05 DIAGNOSIS — E1151 Type 2 diabetes mellitus with diabetic peripheral angiopathy without gangrene: Secondary | ICD-10-CM

## 2016-09-05 DIAGNOSIS — M79605 Pain in left leg: Secondary | ICD-10-CM

## 2016-09-05 NOTE — Progress Notes (Signed)
   Subjective:    Patient ID: Nathaniel Serrano, male    DOB: July 17, 1936, 80 y.o.   MRN: [id number]  HPI Chief Complaint  Patient presents with  . Debridement    Bilateral nail trim; pt Diabetic Type 2; Sugar=210 this am; A1C=7.1  . Diabetic Foot care    Pt wants to have feet check       Review of Systems  Constitutional: Positive for activity change and fatigue.  Cardiovascular: Positive for leg swelling.  Gastrointestinal: Positive for abdominal distention and constipation.  Endocrine: Positive for polydipsia.  All other systems reviewed and are negative.      Objective:   Physical Exam        Assessment & Plan:

## 2016-09-07 NOTE — Progress Notes (Signed)
Subjective:     Patient ID: Nathaniel Serrano, male   DOB: August 14, 1936, 80 y.o.   MRN: [id number]  HPI patient presents with severe circulatory neurological disease with long-term amputation fifth digits with nail disease 2 through 5 bilateral and also pre-ulcerative condition with history of being in hospital for condition   Review of Systems  All other systems reviewed and are negative.      Objective:   Physical Exam  Constitutional: He is oriented to person, place, and time.  Musculoskeletal: Normal range of motion.  Neurological: He is oriented to person, place, and time.  Skin: Skin is warm and dry.  Nursing note and vitals reviewed.  patient is noted to have significant reduction of neurological and vascular status with vascular status that's watched carefully with history of bypass. Patient has rigid digital contracture of the lesser digits and is had some redness on top of the toes that is not broken down currently and his history of ulceration right foot which is not broken down currently. Has nail disease 2 through 5 bilateral that are incurvated in the corners impossible for him to cut with high risk factors     Assessment:     At risk diabetic with nail disease and history of ulceration amputation    Plan:     H&P condition reviewed and patient is having diabetic shoes made for him currently. Patient had debridement accomplished 2 through 5 today and I gave him strict instructions on daily inspections and what to do if any drainage or redness or swelling were to occur

## 2016-12-10 ENCOUNTER — Ambulatory Visit (INDEPENDENT_AMBULATORY_CARE_PROVIDER_SITE_OTHER): Payer: Medicare Other | Admitting: Podiatry

## 2016-12-10 DIAGNOSIS — E1149 Type 2 diabetes mellitus with other diabetic neurological complication: Secondary | ICD-10-CM

## 2016-12-10 DIAGNOSIS — M79605 Pain in left leg: Secondary | ICD-10-CM | POA: Diagnosis not present

## 2016-12-10 DIAGNOSIS — E1140 Type 2 diabetes mellitus with diabetic neuropathy, unspecified: Secondary | ICD-10-CM | POA: Diagnosis not present

## 2016-12-10 DIAGNOSIS — E1151 Type 2 diabetes mellitus with diabetic peripheral angiopathy without gangrene: Secondary | ICD-10-CM | POA: Diagnosis not present

## 2016-12-10 DIAGNOSIS — Q828 Other specified congenital malformations of skin: Secondary | ICD-10-CM | POA: Diagnosis not present

## 2016-12-10 DIAGNOSIS — B351 Tinea unguium: Secondary | ICD-10-CM

## 2016-12-10 DIAGNOSIS — M79676 Pain in unspecified toe(s): Secondary | ICD-10-CM

## 2016-12-10 DIAGNOSIS — M79604 Pain in right leg: Secondary | ICD-10-CM

## 2016-12-10 NOTE — Progress Notes (Signed)
Subjective:     Patient ID: Nathaniel Serrano, male   DOB: 14-Nov-1936, 81 y.o.   MRN: [id number]  HPI patient presents with painful nailbeds 1-5 both feet that are thick and incurvated and lesions on both feet that bother her with long-term history of neuropathy with long-term diabetes   Review of Systems     Objective:   Physical Exam Neurovascular status diminished with patient found to have nail disease bilateral 1-5 with thick yellow brittle nails and keratotic lesion bilateral with neurological involvement    Assessment:     At risk diabetic with mycotic nail infections and porokeratotic type lesions    Plan:     H&P conditions reviewed and deep debridement accomplished along with debridement of lesions. Patient tolerated procedure well and will be seen back for routine care and continue to do daily inspections

## 2016-12-11 ENCOUNTER — Other Ambulatory Visit: Payer: Self-pay | Admitting: Podiatry

## 2016-12-11 DIAGNOSIS — I739 Peripheral vascular disease, unspecified: Secondary | ICD-10-CM

## 2016-12-12 ENCOUNTER — Ambulatory Visit (HOSPITAL_COMMUNITY)
Admission: RE | Admit: 2016-12-12 | Discharge: 2016-12-12 | Disposition: A | Discharge status: Home / Self Care | Payer: Medicare Other | Source: Ambulatory Visit | Attending: Internal Medicine | Admitting: Internal Medicine

## 2016-12-12 DIAGNOSIS — I739 Peripheral vascular disease, unspecified: Secondary | ICD-10-CM | POA: Insufficient documentation

## 2016-12-12 DIAGNOSIS — E1151 Type 2 diabetes mellitus with diabetic peripheral angiopathy without gangrene: Secondary | ICD-10-CM | POA: Diagnosis present

## 2016-12-12 DIAGNOSIS — I743 Embolism and thrombosis of arteries of the lower extremities: Secondary | ICD-10-CM | POA: Diagnosis not present

## 2016-12-12 DIAGNOSIS — R9439 Abnormal result of other cardiovascular function study: Secondary | ICD-10-CM | POA: Diagnosis not present

## 2016-12-19 ENCOUNTER — Telehealth: Payer: Self-pay | Admitting: Podiatry

## 2016-12-22 ENCOUNTER — Ambulatory Visit (INDEPENDENT_AMBULATORY_CARE_PROVIDER_SITE_OTHER): Payer: Medicare Other | Admitting: Podiatry

## 2016-12-22 DIAGNOSIS — E1151 Type 2 diabetes mellitus with diabetic peripheral angiopathy without gangrene: Secondary | ICD-10-CM

## 2016-12-22 DIAGNOSIS — L97401 Non-pressure chronic ulcer of unspecified heel and midfoot limited to breakdown of skin: Secondary | ICD-10-CM

## 2016-12-23 NOTE — Progress Notes (Signed)
Subjective:     Patient ID: Nathaniel Serrano, male   DOB: [birth year], 81 y.o.   MRN: [id number]  HPI patient presents with wife concerned because he had some redness on the outside of his left foot but they state it's been much better the last couple days   Review of Systems     Objective:   Physical Exam Neurovascular status intact or unchanged from previous visit with patient who has chronic ulceration left lateral midfoot that rubs and is now crusted over and healing. Patient also is concerned about neuropathic like symptomatology and circulatory issues. Area of crusted tissue measuring 4 x 4 mm with no subcutaneous exposure and good digital perfusion of the area with no proximal edema erythema drainage noted    Assessment:     At risk diabetic with circulatory issues and ulceration left lateral foot localized in nature with no proximal edema erythema or drainage noted    Plan:     Reviewed condition and debrided tissue applied Iodosorb with sterile dressing instructed on padding the area and that it should heal uneventfully. Gave strict instructions of any redness drainage or other changes were to occur to let us know immediately and if any systemic signs of infection were to occur patient is to go straight to the emergency room

## 2017-01-08 NOTE — Telephone Encounter (Signed)
error 

## 2017-01-16 ENCOUNTER — Ambulatory Visit (HOSPITAL_COMMUNITY)
Admission: RE | Admit: 2017-01-16 | Discharge: 2017-01-16 | Disposition: A | Discharge status: Home / Self Care | Payer: Medicare Other | Source: Ambulatory Visit | Attending: Internal Medicine | Admitting: Internal Medicine

## 2017-01-16 ENCOUNTER — Other Ambulatory Visit: Payer: Self-pay | Admitting: Internal Medicine

## 2017-01-16 ENCOUNTER — Encounter (HOSPITAL_BASED_OUTPATIENT_CLINIC_OR_DEPARTMENT_OTHER): Payer: Medicare Other | Attending: Internal Medicine

## 2017-01-16 DIAGNOSIS — E11621 Type 2 diabetes mellitus with foot ulcer: Secondary | ICD-10-CM | POA: Insufficient documentation

## 2017-01-16 DIAGNOSIS — M069 Rheumatoid arthritis, unspecified: Secondary | ICD-10-CM | POA: Insufficient documentation

## 2017-01-16 DIAGNOSIS — L97518 Non-pressure chronic ulcer of other part of right foot with other specified severity: Secondary | ICD-10-CM | POA: Diagnosis not present

## 2017-01-16 DIAGNOSIS — M86172 Other acute osteomyelitis, left ankle and foot: Secondary | ICD-10-CM

## 2017-01-16 DIAGNOSIS — L03116 Cellulitis of left lower limb: Secondary | ICD-10-CM | POA: Diagnosis not present

## 2017-01-16 DIAGNOSIS — L97523 Non-pressure chronic ulcer of other part of left foot with necrosis of muscle: Secondary | ICD-10-CM | POA: Insufficient documentation

## 2017-01-16 DIAGNOSIS — Z89422 Acquired absence of other left toe(s): Secondary | ICD-10-CM | POA: Insufficient documentation

## 2017-01-16 DIAGNOSIS — E1151 Type 2 diabetes mellitus with diabetic peripheral angiopathy without gangrene: Secondary | ICD-10-CM | POA: Insufficient documentation

## 2017-01-16 DIAGNOSIS — M7989 Other specified soft tissue disorders: Secondary | ICD-10-CM | POA: Diagnosis not present

## 2017-01-16 DIAGNOSIS — E1142 Type 2 diabetes mellitus with diabetic polyneuropathy: Secondary | ICD-10-CM | POA: Diagnosis not present

## 2017-01-16 DIAGNOSIS — I890 Lymphedema, not elsewhere classified: Secondary | ICD-10-CM | POA: Diagnosis not present

## 2017-01-16 DIAGNOSIS — I7025 Atherosclerosis of native arteries of other extremities with ulceration: Secondary | ICD-10-CM | POA: Insufficient documentation

## 2017-01-16 DIAGNOSIS — I2510 Atherosclerotic heart disease of native coronary artery without angina pectoris: Secondary | ICD-10-CM | POA: Diagnosis not present

## 2017-01-16 DIAGNOSIS — L97529 Non-pressure chronic ulcer of other part of left foot with unspecified severity: Secondary | ICD-10-CM | POA: Insufficient documentation

## 2017-01-23 DIAGNOSIS — E11621 Type 2 diabetes mellitus with foot ulcer: Secondary | ICD-10-CM | POA: Diagnosis not present

## 2017-01-27 ENCOUNTER — Encounter: Payer: Self-pay | Admitting: Cardiovascular Disease

## 2017-01-27 ENCOUNTER — Ambulatory Visit (INDEPENDENT_AMBULATORY_CARE_PROVIDER_SITE_OTHER): Payer: Medicare Other | Admitting: Cardiovascular Disease

## 2017-01-27 VITALS — BP 153/80 | HR 69 | Wt 266.0 lb

## 2017-01-27 DIAGNOSIS — Z951 Presence of aortocoronary bypass graft: Secondary | ICD-10-CM | POA: Diagnosis not present

## 2017-01-27 DIAGNOSIS — E785 Hyperlipidemia, unspecified: Secondary | ICD-10-CM

## 2017-01-27 DIAGNOSIS — I739 Peripheral vascular disease, unspecified: Secondary | ICD-10-CM | POA: Diagnosis not present

## 2017-01-27 NOTE — Assessment & Plan Note (Signed)
Nathaniel Serrano was referred to me by Dr. Leanord Hawking for evaluation of critical limb ischemia. He does have a left heel ulcer as well as an ulcer on his right first and second toes. He has had what sounds like right femoropopliteal bypass grafting as well as right fifth toe amputation at [facility] 2014. He does have peripheral neuropathy and does not ambulate much at all. He had Dopplers performed in our office 12/12/16 that showed noncompressible vessels with calcification and officially elevated ABIs however the velocities in both legs did not show severe obstructive disease. He had 2 vessel runoff on the right and 3 on the left. I can't feel pedal pulses on both sides. In addition, he has stage III kidney disease followed by a nephrologist making angiography for problematic. At this point, I do not think he needs an invasive approach would rather recommend continued aggressive wound care. I will see him back in 3 months for follow-up.

## 2017-01-27 NOTE — Progress Notes (Signed)
01/27/2017 ANGELOS WASCO   [birth year]  [id number]  Primary Physician Selina Cooley, MD Primary Cardiologist: Runell Gess MD Roseanne Reno  HPI:  Mr Galdamez is a pleasant 81 year old mildly overweight married Caucasian male father of one child with no grandchildren is a copy by his wife Britta Mccreedy. 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 His history is remarkable for treated hypertension and diabetes. He had coronary artery bypass grafting at [facility] 1992. He quit smoking at that time. He had a right femoropopliteal bypass graft at [facility] 2014 along with a right fifth toe amputation. He does have stage III chronic kidney disease as well followed by a nephrologist. He has diabetic peripheral neuropathy and does not ambulate much. He was referred by Dr. Leanord Hawking at the wound care center for peripheral vascular evaluation. He does have a wound on his left heel as well as on his right first and second toes. Dopplers performed in office 12/12/16 showed artificially elevated ABIs with calcified vessels. He had no large vessel obstructive disease with two-vessel runoff on the right and 3 on the left.   Current Outpatient Prescriptions  Medication Sig Dispense Refill  . amLODipine (NORVASC) 5 MG tablet Take 10 mg by mouth daily.     Marland Kitchen aspirin EC 81 MG tablet Take 81 mg by mouth daily. Take by mouth.    . clopidogrel (PLAVIX) 75 MG tablet Take 75 mg by mouth daily with breakfast.    . escitalopram (LEXAPRO) 20 MG tablet Take by mouth.    . fentaNYL (DURAGESIC - DOSED MCG/HR) 50 MCG/HR Place 50 mcg onto the skin every 3 (three) days.    . fluticasone (FLOVENT HFA) 110 MCG/ACT inhaler Inhale into the lungs.    . folic acid (FOLVITE) 1 MG tablet Take 1 mg by mouth daily.     . furosemide (LASIX) 20 MG tablet Take by mouth.    Marland Kitchen glucose blood (ONETOUCH VERIO) test strip Use as directed    . GRALISE 300 MG  TABS     . Insulin Glargine (LANTUS SOLOSTAR) 100 UNIT/ML Solostar Pen Inject 30-40 Units into the skin 2 (two) times daily. Inject 40 units every morning and 30 units every night    . insulin lispro (HUMALOG KWIKPEN) 100 UNIT/ML KiwkPen Inject 0-28 Units into the skin 3 (three) times daily. Per sliding scale    . LINZESS 290 MCG CAPS capsule     . Methotrexate Sodium, PF, 250 MG/10ML SOLN Inject 17.5 mg into the skin once a week. Every Friday (0.7 mls)    . metoprolol tartrate (LOPRESSOR) 25 MG tablet Take 25 mg by mouth 2 (two) times daily.    Letta Pate VERIO test strip     . ORENCIA 125 MG/ML SOSY     . oxyCODONE-acetaminophen (PERCOCET) 10-325 MG tablet Take by mouth.    . polyethylene glycol (MIRALAX / GLYCOLAX) packet Take 17 g by mouth daily.     No current facility-administered medications for this visit.     Allergies  Allergen Reactions  . Nsaids Other (See Comments)    Was told by kidney specialist not to take    Social History   Social History  . Marital status: Married    Spouse name: N/A  . Number of children: N/A  . Years of education: N/A   Occupational History  . Not on file.   Social History Main  Topics  . Smoking status: Former Games developer  . Smokeless tobacco: Never Used  . Alcohol use No  . Drug use: No  . Sexual activity: Not on file   Other Topics Concern  . Not on file   Social History Narrative  . No narrative on file     Review of Systems: General: negative for chills, fever, night sweats or weight changes.  Cardiovascular: negative for chest pain, dyspnea on exertion, edema, orthopnea, palpitations, paroxysmal nocturnal dyspnea or shortness of breath Dermatological: negative for rash Respiratory: negative for cough or wheezing Urologic: negative for hematuria Abdominal: negative for nausea, vomiting, diarrhea, bright red blood per rectum, melena, or hematemesis Neurologic: negative for visual changes, syncope, or dizziness All other systems  reviewed and are otherwise negative except as noted above.    Blood pressure (!) 153/80, pulse 69, weight 266 lb (120.7 kg).  General appearance: alert and no distress Neck: no adenopathy, no carotid bruit, no JVD, supple, symmetrical, trachea midline and thyroid not enlarged, symmetric, no tenderness/mass/nodules Lungs: clear to auscultation bilaterally Heart: regular rate and rhythm, S1, S2 normal, no murmur, click, rub or gallop Extremities: extremities normal, atraumatic, no cyanosis or edema  EKG sinus rhythm at 69 with right bundle branch block and PACs. I personally reviewed this EKG.  ASSESSMENT AND PLAN:   PVD (peripheral vascular disease) [facility]) Mr. Barretta was referred to me by Dr. Leanord Hawking for evaluation of critical limb ischemia. He does have a left heel ulcer as well as an ulcer on his right first and second toes. He has had what sounds like right femoropopliteal bypass grafting as well as right fifth toe amputation at [facility] 2014. He does have peripheral neuropathy and does not ambulate much at all. He had Dopplers performed in our office 12/12/16 that showed noncompressible vessels with calcification and officially elevated ABIs however the velocities in both legs did not show severe obstructive disease. He had 2 vessel runoff on the right and 3 on the left. I can't feel pedal pulses on both sides. In addition, he has stage III kidney disease followed by a nephrologist making angiography for problematic. At this point, I do not think he needs an invasive approach would rather recommend continued aggressive wound care. I will see him back in 3 months for follow-up.  Dyslipidemia History of hyperlipidemia not on statin therapy followed by his PCP      Runell Gess MD [facility], [facility] 01/27/2017 11:52 AM

## 2017-01-27 NOTE — Patient Instructions (Signed)
Medication Instructions: Your physician recommends that you continue on your current medications as directed. Please refer to the Current Medication list given to you today.   Follow-Up: Your physician recommends that you schedule a follow-up appointment in: 3 months with Dr. Berry.  If you need a refill on your cardiac medications before your next appointment, please call your pharmacy.  

## 2017-01-27 NOTE — Assessment & Plan Note (Signed)
History of hyperlipidemia not on statin therapy followed by his PCP 

## 2017-01-30 ENCOUNTER — Encounter (HOSPITAL_BASED_OUTPATIENT_CLINIC_OR_DEPARTMENT_OTHER): Payer: Medicare Other | Attending: Internal Medicine

## 2017-01-30 DIAGNOSIS — I2510 Atherosclerotic heart disease of native coronary artery without angina pectoris: Secondary | ICD-10-CM | POA: Insufficient documentation

## 2017-01-30 DIAGNOSIS — E1140 Type 2 diabetes mellitus with diabetic neuropathy, unspecified: Secondary | ICD-10-CM | POA: Diagnosis not present

## 2017-01-30 DIAGNOSIS — E1151 Type 2 diabetes mellitus with diabetic peripheral angiopathy without gangrene: Secondary | ICD-10-CM | POA: Insufficient documentation

## 2017-01-30 DIAGNOSIS — E11621 Type 2 diabetes mellitus with foot ulcer: Secondary | ICD-10-CM | POA: Insufficient documentation

## 2017-01-30 DIAGNOSIS — N183 Chronic kidney disease, stage 3 (moderate): Secondary | ICD-10-CM | POA: Insufficient documentation

## 2017-01-30 DIAGNOSIS — L97521 Non-pressure chronic ulcer of other part of left foot limited to breakdown of skin: Secondary | ICD-10-CM | POA: Insufficient documentation

## 2017-01-30 DIAGNOSIS — I129 Hypertensive chronic kidney disease with stage 1 through stage 4 chronic kidney disease, or unspecified chronic kidney disease: Secondary | ICD-10-CM | POA: Diagnosis not present

## 2017-01-30 DIAGNOSIS — E1122 Type 2 diabetes mellitus with diabetic chronic kidney disease: Secondary | ICD-10-CM | POA: Insufficient documentation

## 2017-01-30 DIAGNOSIS — L97522 Non-pressure chronic ulcer of other part of left foot with fat layer exposed: Secondary | ICD-10-CM | POA: Diagnosis not present

## 2017-01-30 DIAGNOSIS — L97516 Non-pressure chronic ulcer of other part of right foot with bone involvement without evidence of necrosis: Secondary | ICD-10-CM | POA: Insufficient documentation

## 2017-02-03 ENCOUNTER — Encounter: Payer: Medicare Other | Admitting: Cardiovascular Disease

## 2017-02-06 ENCOUNTER — Other Ambulatory Visit: Payer: Self-pay | Admitting: Internal Medicine

## 2017-02-06 DIAGNOSIS — E11621 Type 2 diabetes mellitus with foot ulcer: Secondary | ICD-10-CM | POA: Diagnosis not present

## 2017-02-13 DIAGNOSIS — E11621 Type 2 diabetes mellitus with foot ulcer: Secondary | ICD-10-CM | POA: Diagnosis not present

## 2017-02-13 LAB — GLUCOSE, CAPILLARY: GLUCOSE-CAPILLARY: 150 mg/dL — AB (ref 65–99)

## 2017-02-20 DIAGNOSIS — E11621 Type 2 diabetes mellitus with foot ulcer: Secondary | ICD-10-CM | POA: Diagnosis not present

## 2017-02-27 ENCOUNTER — Ambulatory Visit (HOSPITAL_COMMUNITY)
Admission: RE | Admit: 2017-02-27 | Discharge: 2017-02-27 | Disposition: A | Discharge status: Home / Self Care | Payer: Medicare Other | Source: Ambulatory Visit | Attending: Nurse Practitioner | Admitting: Nurse Practitioner

## 2017-02-27 ENCOUNTER — Other Ambulatory Visit: Payer: Self-pay | Admitting: Nurse Practitioner

## 2017-02-27 DIAGNOSIS — M818 Other osteoporosis without current pathological fracture: Secondary | ICD-10-CM | POA: Diagnosis not present

## 2017-02-27 DIAGNOSIS — M21271 Flexion deformity, right ankle and toes: Secondary | ICD-10-CM | POA: Diagnosis not present

## 2017-02-27 DIAGNOSIS — M86171 Other acute osteomyelitis, right ankle and foot: Secondary | ICD-10-CM | POA: Diagnosis present

## 2017-02-27 DIAGNOSIS — Z9889 Other specified postprocedural states: Secondary | ICD-10-CM | POA: Insufficient documentation

## 2017-02-27 DIAGNOSIS — E11621 Type 2 diabetes mellitus with foot ulcer: Secondary | ICD-10-CM | POA: Diagnosis not present

## 2017-03-02 ENCOUNTER — Ambulatory Visit (INDEPENDENT_AMBULATORY_CARE_PROVIDER_SITE_OTHER): Payer: Medicare Other | Admitting: Podiatry

## 2017-03-02 ENCOUNTER — Ambulatory Visit (INDEPENDENT_AMBULATORY_CARE_PROVIDER_SITE_OTHER): Payer: Medicare Other

## 2017-03-02 DIAGNOSIS — L97401 Non-pressure chronic ulcer of unspecified heel and midfoot limited to breakdown of skin: Secondary | ICD-10-CM

## 2017-03-02 DIAGNOSIS — E1151 Type 2 diabetes mellitus with diabetic peripheral angiopathy without gangrene: Secondary | ICD-10-CM

## 2017-03-02 NOTE — Progress Notes (Signed)
Subjective:     Patient ID: Nathaniel Serrano, male   DOB: 28-Apr-1936, 81 y.o.   MRN: [id number]  HPI patient presents with caregiver with a ulceration of the left base of fifth metatarsal measuring about 3 cm x 2 cm that was treated at the wound care center without success. They've utilize [employer] without success at this time and he does have diabetic shoes that he got from [place] [facility] which could be part of the problem   Review of Systems     Objective:   Physical Exam  Significant circulatory disease but patient's caregiver states that he was at Dr. Benay Spice office 2 months ago and that everything was adequately controlled. I noted there to be a draining ulceration measuring 3 x 2 mm the left base of fifth metatarsal with no bone exposure but quite a bit of subcutaneous exposure    Assessment:     Ulceration left fifth metatarsal with previous surgery performed for infection    Plan:     H&P condition reviewed. Patient is on Cipro and will continue and today I flushed clean the wound applied Silvadene with sterile dressing and I want him to see Dr. Logan Bores next week for the consideration of wound VAC or possible graft. I did dispensed surgical shoe today  X-ray taken revealed some irritation around the base of the fifth metatarsal but nothing active that I could note currently

## 2017-03-04 ENCOUNTER — Other Ambulatory Visit: Payer: Self-pay | Admitting: Nurse Practitioner

## 2017-03-04 DIAGNOSIS — L03116 Cellulitis of left lower limb: Secondary | ICD-10-CM

## 2017-03-04 DIAGNOSIS — E1151 Type 2 diabetes mellitus with diabetic peripheral angiopathy without gangrene: Secondary | ICD-10-CM

## 2017-03-04 DIAGNOSIS — E11621 Type 2 diabetes mellitus with foot ulcer: Secondary | ICD-10-CM

## 2017-03-04 DIAGNOSIS — L97509 Non-pressure chronic ulcer of other part of unspecified foot with unspecified severity: Principal | ICD-10-CM

## 2017-03-04 DIAGNOSIS — E1142 Type 2 diabetes mellitus with diabetic polyneuropathy: Secondary | ICD-10-CM

## 2017-03-06 ENCOUNTER — Other Ambulatory Visit (HOSPITAL_COMMUNITY)
Admit: 2017-03-06 | Discharge: 2017-03-06 | Disposition: A | Discharge status: Home / Self Care | Payer: Medicare Other | Source: Ambulatory Visit | Attending: Internal Medicine | Admitting: Internal Medicine

## 2017-03-06 ENCOUNTER — Encounter (HOSPITAL_BASED_OUTPATIENT_CLINIC_OR_DEPARTMENT_OTHER): Payer: Medicare Other | Attending: Internal Medicine

## 2017-03-06 ENCOUNTER — Telehealth: Payer: Self-pay | Admitting: Cardiovascular Disease

## 2017-03-06 DIAGNOSIS — I739 Peripheral vascular disease, unspecified: Secondary | ICD-10-CM | POA: Diagnosis not present

## 2017-03-06 DIAGNOSIS — E1142 Type 2 diabetes mellitus with diabetic polyneuropathy: Secondary | ICD-10-CM | POA: Insufficient documentation

## 2017-03-06 DIAGNOSIS — L97523 Non-pressure chronic ulcer of other part of left foot with necrosis of muscle: Secondary | ICD-10-CM | POA: Insufficient documentation

## 2017-03-06 DIAGNOSIS — G40909 Epilepsy, unspecified, not intractable, without status epilepticus: Secondary | ICD-10-CM | POA: Diagnosis not present

## 2017-03-06 DIAGNOSIS — E1151 Type 2 diabetes mellitus with diabetic peripheral angiopathy without gangrene: Secondary | ICD-10-CM | POA: Diagnosis not present

## 2017-03-06 DIAGNOSIS — L89612 Pressure ulcer of right heel, stage 2: Secondary | ICD-10-CM | POA: Insufficient documentation

## 2017-03-06 DIAGNOSIS — M069 Rheumatoid arthritis, unspecified: Secondary | ICD-10-CM | POA: Diagnosis not present

## 2017-03-06 DIAGNOSIS — E11621 Type 2 diabetes mellitus with foot ulcer: Secondary | ICD-10-CM | POA: Diagnosis present

## 2017-03-06 DIAGNOSIS — I2510 Atherosclerotic heart disease of native coronary artery without angina pectoris: Secondary | ICD-10-CM | POA: Insufficient documentation

## 2017-03-06 DIAGNOSIS — H269 Unspecified cataract: Secondary | ICD-10-CM | POA: Diagnosis not present

## 2017-03-06 DIAGNOSIS — L03116 Cellulitis of left lower limb: Secondary | ICD-10-CM | POA: Insufficient documentation

## 2017-03-06 DIAGNOSIS — F419 Anxiety disorder, unspecified: Secondary | ICD-10-CM | POA: Insufficient documentation

## 2017-03-06 DIAGNOSIS — L97518 Non-pressure chronic ulcer of other part of right foot with other specified severity: Secondary | ICD-10-CM | POA: Insufficient documentation

## 2017-03-06 DIAGNOSIS — I890 Lymphedema, not elsewhere classified: Secondary | ICD-10-CM | POA: Diagnosis not present

## 2017-03-06 DIAGNOSIS — M869 Osteomyelitis, unspecified: Secondary | ICD-10-CM | POA: Diagnosis not present

## 2017-03-06 DIAGNOSIS — I10 Essential (primary) hypertension: Secondary | ICD-10-CM | POA: Insufficient documentation

## 2017-03-06 NOTE — Telephone Encounter (Signed)
Received a call from [facility] NP with wound care at [facility].Stated she saw patient today for Dr.Robson.Stated she wanted Dr.Berry's opinion about patient's pv condition.Stated his wounds are regressing.Stated she wanted to know if patient needs another intervention.She wants to give patient some reassurance.Advised Dr.Berry out of office.I will send message to him for advice.She advised to call her back at # [phone] or call Dr.Robson at # [phone].

## 2017-03-06 NOTE — Telephone Encounter (Signed)
New message    Leah from the wound center at [facility] is calling to talk about pt.

## 2017-03-09 ENCOUNTER — Ambulatory Visit (INDEPENDENT_AMBULATORY_CARE_PROVIDER_SITE_OTHER): Payer: Medicare Other | Admitting: Podiatry

## 2017-03-09 ENCOUNTER — Other Ambulatory Visit: Payer: Medicare Other

## 2017-03-09 DIAGNOSIS — L97523 Non-pressure chronic ulcer of other part of left foot with necrosis of muscle: Secondary | ICD-10-CM

## 2017-03-09 DIAGNOSIS — E1143 Type 2 diabetes mellitus with diabetic autonomic (poly)neuropathy: Secondary | ICD-10-CM | POA: Diagnosis not present

## 2017-03-09 DIAGNOSIS — L97514 Non-pressure chronic ulcer of other part of right foot with necrosis of bone: Secondary | ICD-10-CM | POA: Diagnosis not present

## 2017-03-09 DIAGNOSIS — L97509 Non-pressure chronic ulcer of other part of unspecified foot with unspecified severity: Secondary | ICD-10-CM | POA: Diagnosis not present

## 2017-03-09 DIAGNOSIS — E08621 Diabetes mellitus due to underlying condition with foot ulcer: Secondary | ICD-10-CM

## 2017-03-09 DIAGNOSIS — I70245 Atherosclerosis of native arteries of left leg with ulceration of other part of foot: Secondary | ICD-10-CM

## 2017-03-09 DIAGNOSIS — I70235 Atherosclerosis of native arteries of right leg with ulceration of other part of foot: Secondary | ICD-10-CM | POA: Diagnosis not present

## 2017-03-09 DIAGNOSIS — E0843 Diabetes mellitus due to underlying condition with diabetic autonomic (poly)neuropathy: Secondary | ICD-10-CM | POA: Diagnosis not present

## 2017-03-09 LAB — AEROBIC CULTURE W GRAM STAIN (SUPERFICIAL SPECIMEN): Gram Stain: NONE SEEN

## 2017-03-09 LAB — AEROBIC CULTURE  (SUPERFICIAL SPECIMEN): CULTURE: NORMAL

## 2017-03-10 NOTE — Progress Notes (Signed)
Subjective:  81 year old male presents today with his spouse for evaluation of multiple ulcerations to the bilateral lower extremities. Patient states she is been managed for the past several years at the [facility] under the care of Dr. Leanord Hawking. Patient states that last week Dr. Leanord Hawking was out of town and they were treated and managed by a nurse practitioner filling in that day.  Patient states that at the last visit at the wound care center MRIs were ordered of the right foot and the patient was referred here to the [facility] for further treatment and evaluation. Patient was seen last week by Dr. Charlsie Merles and referred to me for presentation today.    Objective/Physical Exam General: The patient is alert and oriented x3 in no acute distress.  Dermatology:  Wound #1 noted to the base of the fifth metatarsal left foot measuring approximately 3.0 cm 3.0 cm 0.2 cm (LxWxD).   to the noted ulceration there is no eschar. There is a moderate amount of slough fibrin and necrotic tissue noted. There is no exposed bone muscle-tendon ligament or joint. Granulation tissue and wound base is red. Periwound integrity is intact. There is a moderate amount of serosanguineous drainage noted.  Wound #2 noted to the IPJ right great toe measuring approximately [phone] cm Wound #3 noted to the IPJ second digit right foot measuring approximately [phone] cm To the noted ulcerations there is no eschar. There is obvious bone exposure to the ulcerations. Wound base is almost entirely osseous in nature. Minimal granulation tissue noted. Moderate amount of serosanguineous drainage noted. Periwound integrity is intact.  Wound #4 noted to the posterior and plantar aspect of the right heel measuring approximately [phone] cm. This is a new ulceration that developed over the last few days. To the noted ulceration there is no eschar. There is minimal slough fibrin and necrotic tissue noted.  Wound base and granulation tissue is red. Periwound integrity is intact and stable.  Vascular: Palpable pedal pulses bilaterally.  Neurological: Epicritic and protective threshold absent bilaterally.   Musculoskeletal Exam:  history of bilateral partial fifth ray amputations noted   Assessment: #1 multiple bilateral lower extremity ulcerations secondary to diabetes mellitus #2 diabetes mellitus w/ peripheral neuropathy #3 osteomyelitis first and second digits right foot   #4 history of bilateral partial fifth ray amputations  Plan of Care:  #1 Patient was evaluated. #2 medically necessary excisional debridement including muscle and fascia was performed using a tissue nipper and a chisel blade. Excisional debridement of all the necrotic nonviable tissue down to healthy bleeding viable tissue was performed with post-debridement measurements same as pre-. #3 the wound was cleansed and dry sterile dressing applied. #4 today I have had a very detailed discussion with the patient and wife regarding surgical management of the exposed bone ulcerations to digits 1 and 2 of the right foot. Patient's spouse states that MRI was ordered of the right foot to verify osteomyelitis, however there is chronic exposed bone to these ulcerations and MRI is likely not warranted. I would likely recommend transmetatarsal amputation right foot due to chronic osteomyelitis of the first and second digits as well as a history of partial fifth ray amputation. This will likely give the patient the most optimal functional M to the right lower extremity.   #5 I also recommend continued wound care management regarding left lower extremity ulcer with possible application of skin graft. #6 instructed the patient and spouse to discuss my recommendations with  Dr. Leanord Hawking at the [facility], due to the long-standing patient physician relationship that has already been established with him.  #6 return to clinic in 2 weeks to  discuss possible treatment options    Felecia Shelling, DPM [facility]  Dr. Felecia Shelling, DPM    [address]                                        [place], [place] [address]                Office [phone]  Fax [phone]

## 2017-03-11 ENCOUNTER — Ambulatory Visit: Payer: Medicare Other

## 2017-03-11 NOTE — Telephone Encounter (Signed)
There are no plans to perform angiography or intervention.

## 2017-03-12 NOTE — Telephone Encounter (Signed)
Spoke to [place] at wound care center.Advised to inform Leah NP of Dr.Berry's recommendations.

## 2017-03-13 ENCOUNTER — Ambulatory Visit (HOSPITAL_COMMUNITY)
Admission: RE | Admit: 2017-03-13 | Discharge: 2017-03-13 | Disposition: A | Discharge status: Home / Self Care | Payer: Medicare Other | Source: Ambulatory Visit | Attending: Nurse Practitioner | Admitting: Nurse Practitioner

## 2017-03-13 ENCOUNTER — Other Ambulatory Visit: Payer: Self-pay | Admitting: Nurse Practitioner

## 2017-03-13 DIAGNOSIS — E11621 Type 2 diabetes mellitus with foot ulcer: Secondary | ICD-10-CM

## 2017-03-13 DIAGNOSIS — L03116 Cellulitis of left lower limb: Secondary | ICD-10-CM | POA: Insufficient documentation

## 2017-03-13 DIAGNOSIS — L97509 Non-pressure chronic ulcer of other part of unspecified foot with unspecified severity: Secondary | ICD-10-CM | POA: Diagnosis not present

## 2017-03-13 DIAGNOSIS — E1151 Type 2 diabetes mellitus with diabetic peripheral angiopathy without gangrene: Secondary | ICD-10-CM | POA: Insufficient documentation

## 2017-03-13 DIAGNOSIS — E1142 Type 2 diabetes mellitus with diabetic polyneuropathy: Secondary | ICD-10-CM | POA: Insufficient documentation

## 2017-03-13 LAB — POCT I-STAT CREATININE: CREATININE: 1.8 mg/dL — AB (ref 0.61–1.24)

## 2017-03-20 ENCOUNTER — Other Ambulatory Visit (HOSPITAL_COMMUNITY)
Admission: RE | Admit: 2017-03-20 | Discharge: 2017-03-20 | Disposition: A | Discharge status: Home / Self Care | Payer: Medicare Other | Source: Other Acute Inpatient Hospital | Attending: Internal Medicine | Admitting: Internal Medicine

## 2017-03-20 DIAGNOSIS — X58XXXA Exposure to other specified factors, initial encounter: Secondary | ICD-10-CM | POA: Diagnosis not present

## 2017-03-20 DIAGNOSIS — E11621 Type 2 diabetes mellitus with foot ulcer: Secondary | ICD-10-CM | POA: Diagnosis not present

## 2017-03-20 DIAGNOSIS — S91301A Unspecified open wound, right foot, initial encounter: Secondary | ICD-10-CM | POA: Diagnosis present

## 2017-03-23 ENCOUNTER — Ambulatory Visit: Payer: Medicare Other | Admitting: Podiatry

## 2017-03-23 LAB — AEROBIC CULTURE  (SUPERFICIAL SPECIMEN): CULTURE: NO GROWTH

## 2017-03-23 LAB — AEROBIC CULTURE W GRAM STAIN (SUPERFICIAL SPECIMEN)

## 2017-03-27 ENCOUNTER — Other Ambulatory Visit (HOSPITAL_COMMUNITY)
Admit: 2017-03-27 | Discharge: 2017-03-27 | Disposition: A | Discharge status: Home / Self Care | Payer: Medicare Other | Source: Ambulatory Visit | Attending: Internal Medicine | Admitting: Internal Medicine

## 2017-03-27 DIAGNOSIS — L97518 Non-pressure chronic ulcer of other part of right foot with other specified severity: Secondary | ICD-10-CM | POA: Diagnosis present

## 2017-03-27 DIAGNOSIS — E1151 Type 2 diabetes mellitus with diabetic peripheral angiopathy without gangrene: Secondary | ICD-10-CM | POA: Insufficient documentation

## 2017-03-27 DIAGNOSIS — E11621 Type 2 diabetes mellitus with foot ulcer: Secondary | ICD-10-CM | POA: Diagnosis not present

## 2017-03-30 ENCOUNTER — Other Ambulatory Visit: Payer: Self-pay | Admitting: Internal Medicine

## 2017-03-30 DIAGNOSIS — M869 Osteomyelitis, unspecified: Secondary | ICD-10-CM

## 2017-03-30 DIAGNOSIS — S91302A Unspecified open wound, left foot, initial encounter: Secondary | ICD-10-CM

## 2017-03-30 LAB — AEROBIC CULTURE  (SUPERFICIAL SPECIMEN): CULTURE: NO GROWTH

## 2017-03-30 LAB — AEROBIC CULTURE W GRAM STAIN (SUPERFICIAL SPECIMEN)

## 2017-04-03 ENCOUNTER — Ambulatory Visit (HOSPITAL_COMMUNITY)
Admission: RE | Admit: 2017-04-03 | Discharge: 2017-04-03 | Disposition: A | Discharge status: Home / Self Care | Payer: Medicare Other | Source: Ambulatory Visit | Attending: Internal Medicine | Admitting: Internal Medicine

## 2017-04-03 ENCOUNTER — Encounter (HOSPITAL_BASED_OUTPATIENT_CLINIC_OR_DEPARTMENT_OTHER): Payer: Medicare Other | Attending: Internal Medicine

## 2017-04-03 DIAGNOSIS — L97523 Non-pressure chronic ulcer of other part of left foot with necrosis of muscle: Secondary | ICD-10-CM | POA: Insufficient documentation

## 2017-04-03 DIAGNOSIS — I890 Lymphedema, not elsewhere classified: Secondary | ICD-10-CM | POA: Insufficient documentation

## 2017-04-03 DIAGNOSIS — M869 Osteomyelitis, unspecified: Secondary | ICD-10-CM

## 2017-04-03 DIAGNOSIS — I10 Essential (primary) hypertension: Secondary | ICD-10-CM | POA: Diagnosis not present

## 2017-04-03 DIAGNOSIS — E11621 Type 2 diabetes mellitus with foot ulcer: Secondary | ICD-10-CM | POA: Diagnosis not present

## 2017-04-03 DIAGNOSIS — E1142 Type 2 diabetes mellitus with diabetic polyneuropathy: Secondary | ICD-10-CM | POA: Insufficient documentation

## 2017-04-03 DIAGNOSIS — S91302A Unspecified open wound, left foot, initial encounter: Secondary | ICD-10-CM

## 2017-04-03 DIAGNOSIS — E1151 Type 2 diabetes mellitus with diabetic peripheral angiopathy without gangrene: Secondary | ICD-10-CM | POA: Insufficient documentation

## 2017-04-03 DIAGNOSIS — L97518 Non-pressure chronic ulcer of other part of right foot with other specified severity: Secondary | ICD-10-CM | POA: Diagnosis not present

## 2017-04-03 DIAGNOSIS — G40909 Epilepsy, unspecified, not intractable, without status epilepticus: Secondary | ICD-10-CM | POA: Diagnosis not present

## 2017-04-03 DIAGNOSIS — F411 Generalized anxiety disorder: Secondary | ICD-10-CM | POA: Diagnosis not present

## 2017-04-03 DIAGNOSIS — L89612 Pressure ulcer of right heel, stage 2: Secondary | ICD-10-CM | POA: Diagnosis not present

## 2017-04-03 DIAGNOSIS — I2510 Atherosclerotic heart disease of native coronary artery without angina pectoris: Secondary | ICD-10-CM | POA: Insufficient documentation

## 2017-04-03 DIAGNOSIS — M069 Rheumatoid arthritis, unspecified: Secondary | ICD-10-CM | POA: Diagnosis not present

## 2017-04-09 DIAGNOSIS — E11621 Type 2 diabetes mellitus with foot ulcer: Secondary | ICD-10-CM | POA: Diagnosis not present

## 2017-04-17 DIAGNOSIS — E11621 Type 2 diabetes mellitus with foot ulcer: Secondary | ICD-10-CM | POA: Diagnosis not present

## 2017-04-24 DIAGNOSIS — E11621 Type 2 diabetes mellitus with foot ulcer: Secondary | ICD-10-CM | POA: Diagnosis not present

## 2017-04-29 ENCOUNTER — Encounter: Payer: Self-pay | Admitting: Cardiovascular Disease

## 2017-04-29 ENCOUNTER — Ambulatory Visit (INDEPENDENT_AMBULATORY_CARE_PROVIDER_SITE_OTHER): Payer: Medicare Other | Admitting: Cardiovascular Disease

## 2017-04-29 DIAGNOSIS — I739 Peripheral vascular disease, unspecified: Secondary | ICD-10-CM | POA: Diagnosis not present

## 2017-04-29 DIAGNOSIS — I70235 Atherosclerosis of native arteries of right leg with ulceration of other part of foot: Secondary | ICD-10-CM

## 2017-04-29 NOTE — Progress Notes (Signed)
History of critical limb ischemia with multiple diabetic ulcers on both feet currently getting aggressive wound care by Dr. Robson at the wound care center on intravenous antibiotics for osteomyelitis with potential future hyperbaric therapy. I have reviewed again the results of the Doppler studies were performed 12/12/16. His ABIs were unobtainable because of noncompressibility. His right femoropopliteal bypass graft was patent. He had no large vessel obstructive disease. I suspect that all his wounds are related to small vessel diabetic disease. I will see him back when necessary. 

## 2017-04-29 NOTE — Patient Instructions (Signed)
Medication Instructions: Your physician recommends that you continue on your current medications as directed. Please refer to the Current Medication list given to you today.   Follow-Up: Your physician recommends that you schedule a follow-up appointment as needed with Dr. Berry.    

## 2017-04-29 NOTE — Assessment & Plan Note (Signed)
History of critical limb ischemia with multiple diabetic ulcers on both feet currently getting aggressive wound care by Dr. Leanord Hawking at the wound care center on intravenous antibiotics for osteomyelitis with potential future hyperbaric therapy. I have reviewed again the results of the Doppler studies were performed 12/12/16. His ABIs were unobtainable because of noncompressibility. His right femoropopliteal bypass graft was patent. He had no large vessel obstructive disease. I suspect that all his wounds are related to small vessel diabetic disease. I will see him back when necessary.

## 2017-04-30 DIAGNOSIS — E11621 Type 2 diabetes mellitus with foot ulcer: Secondary | ICD-10-CM | POA: Diagnosis not present

## 2017-05-08 ENCOUNTER — Encounter (HOSPITAL_BASED_OUTPATIENT_CLINIC_OR_DEPARTMENT_OTHER): Payer: Medicare Other | Attending: Internal Medicine

## 2017-05-08 DIAGNOSIS — Z7984 Long term (current) use of oral hypoglycemic drugs: Secondary | ICD-10-CM | POA: Insufficient documentation

## 2017-05-08 DIAGNOSIS — E11621 Type 2 diabetes mellitus with foot ulcer: Secondary | ICD-10-CM | POA: Diagnosis not present

## 2017-05-08 DIAGNOSIS — E1140 Type 2 diabetes mellitus with diabetic neuropathy, unspecified: Secondary | ICD-10-CM | POA: Diagnosis not present

## 2017-05-08 DIAGNOSIS — I10 Essential (primary) hypertension: Secondary | ICD-10-CM | POA: Diagnosis not present

## 2017-05-08 DIAGNOSIS — I2510 Atherosclerotic heart disease of native coronary artery without angina pectoris: Secondary | ICD-10-CM | POA: Diagnosis not present

## 2017-05-08 DIAGNOSIS — E1151 Type 2 diabetes mellitus with diabetic peripheral angiopathy without gangrene: Secondary | ICD-10-CM | POA: Insufficient documentation

## 2017-05-08 DIAGNOSIS — L89612 Pressure ulcer of right heel, stage 2: Secondary | ICD-10-CM | POA: Diagnosis not present

## 2017-05-08 DIAGNOSIS — L97312 Non-pressure chronic ulcer of right ankle with fat layer exposed: Secondary | ICD-10-CM | POA: Diagnosis not present

## 2017-05-08 DIAGNOSIS — L97512 Non-pressure chronic ulcer of other part of right foot with fat layer exposed: Secondary | ICD-10-CM | POA: Insufficient documentation

## 2017-05-08 DIAGNOSIS — E1142 Type 2 diabetes mellitus with diabetic polyneuropathy: Secondary | ICD-10-CM | POA: Diagnosis not present

## 2017-05-08 DIAGNOSIS — M86371 Chronic multifocal osteomyelitis, right ankle and foot: Secondary | ICD-10-CM | POA: Diagnosis not present

## 2017-05-08 DIAGNOSIS — L97522 Non-pressure chronic ulcer of other part of left foot with fat layer exposed: Secondary | ICD-10-CM | POA: Insufficient documentation

## 2017-05-08 DIAGNOSIS — M069 Rheumatoid arthritis, unspecified: Secondary | ICD-10-CM | POA: Insufficient documentation

## 2017-05-08 DIAGNOSIS — E11622 Type 2 diabetes mellitus with other skin ulcer: Secondary | ICD-10-CM | POA: Insufficient documentation

## 2017-05-08 DIAGNOSIS — L97519 Non-pressure chronic ulcer of other part of right foot with unspecified severity: Secondary | ICD-10-CM | POA: Diagnosis not present

## 2017-05-14 ENCOUNTER — Other Ambulatory Visit (HOSPITAL_COMMUNITY)
Admission: RE | Admit: 2017-05-14 | Discharge: 2017-05-14 | Disposition: A | Discharge status: Home / Self Care | Payer: Medicare Other | Source: Other Acute Inpatient Hospital | Attending: Internal Medicine | Admitting: Internal Medicine

## 2017-05-14 DIAGNOSIS — E1151 Type 2 diabetes mellitus with diabetic peripheral angiopathy without gangrene: Secondary | ICD-10-CM | POA: Diagnosis not present

## 2017-05-14 DIAGNOSIS — L89612 Pressure ulcer of right heel, stage 2: Secondary | ICD-10-CM | POA: Diagnosis present

## 2017-05-17 LAB — AEROBIC CULTURE W GRAM STAIN (SUPERFICIAL SPECIMEN)

## 2017-05-17 LAB — AEROBIC CULTURE  (SUPERFICIAL SPECIMEN)

## 2018-09-14 IMAGING — CR DG FOOT COMPLETE 3+V*R*
3 series · 3 of 3 positions shown · non-contrast
Comparison: July 04, 2016

CLINICAL DATA: Nonhealing ulcer medially

EXAM:
RIGHT FOOT COMPLETE - 3+ VIEW

[t foot ap right]
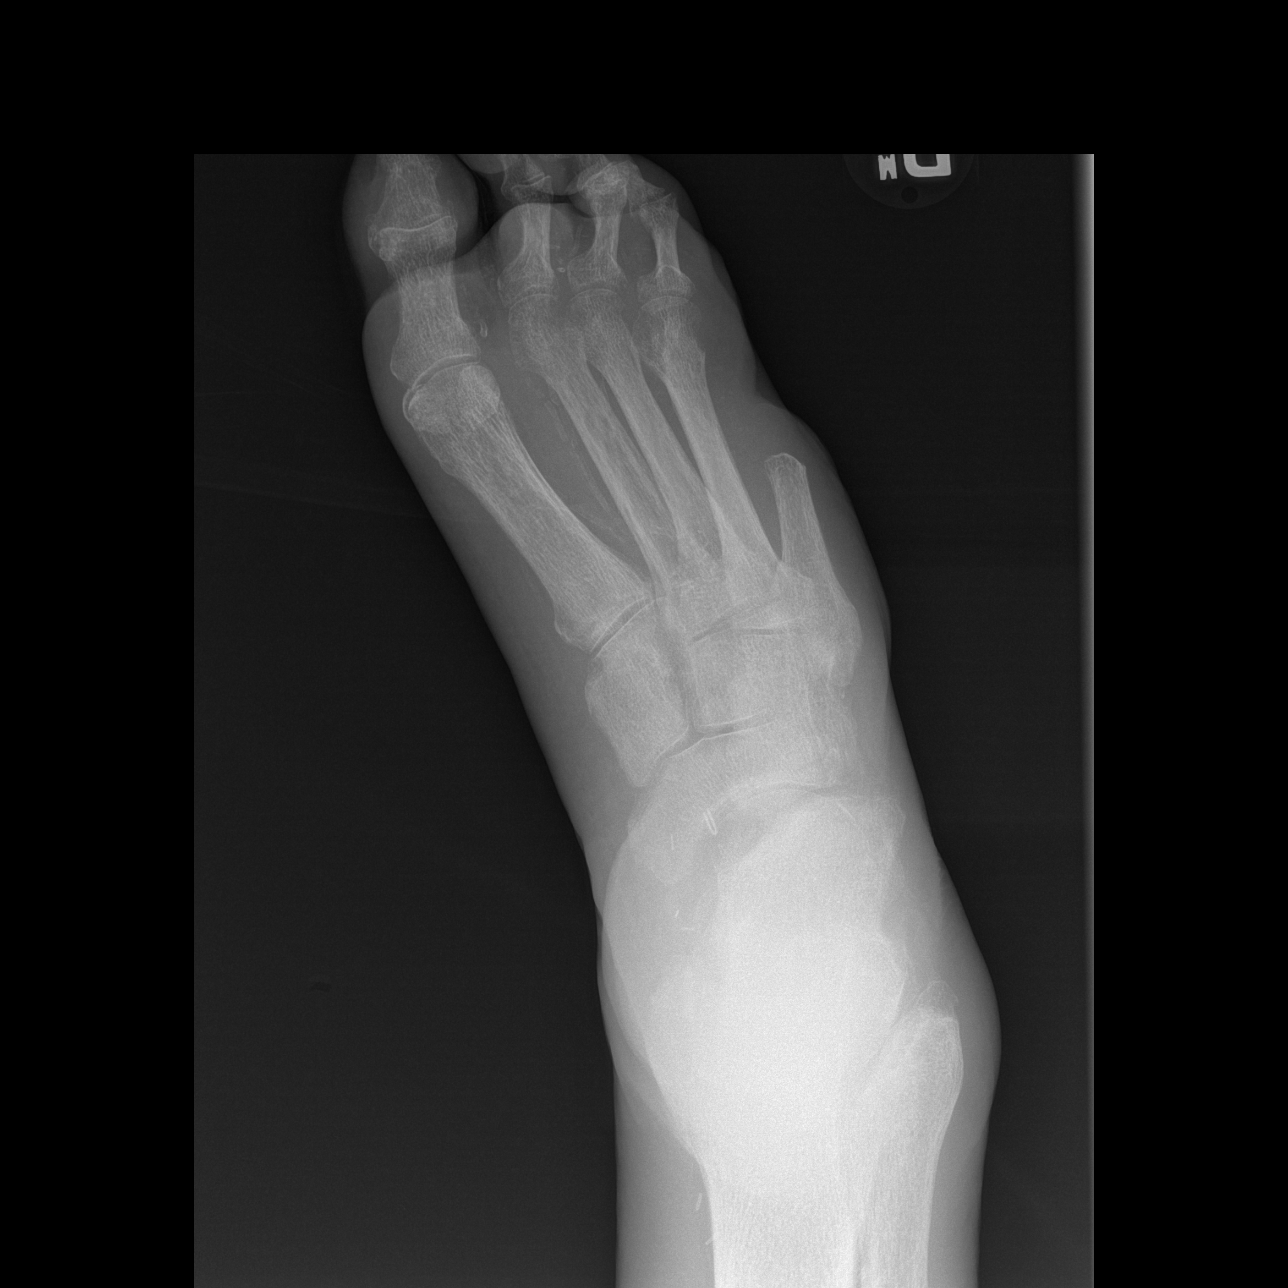

[t foot oblique right *]
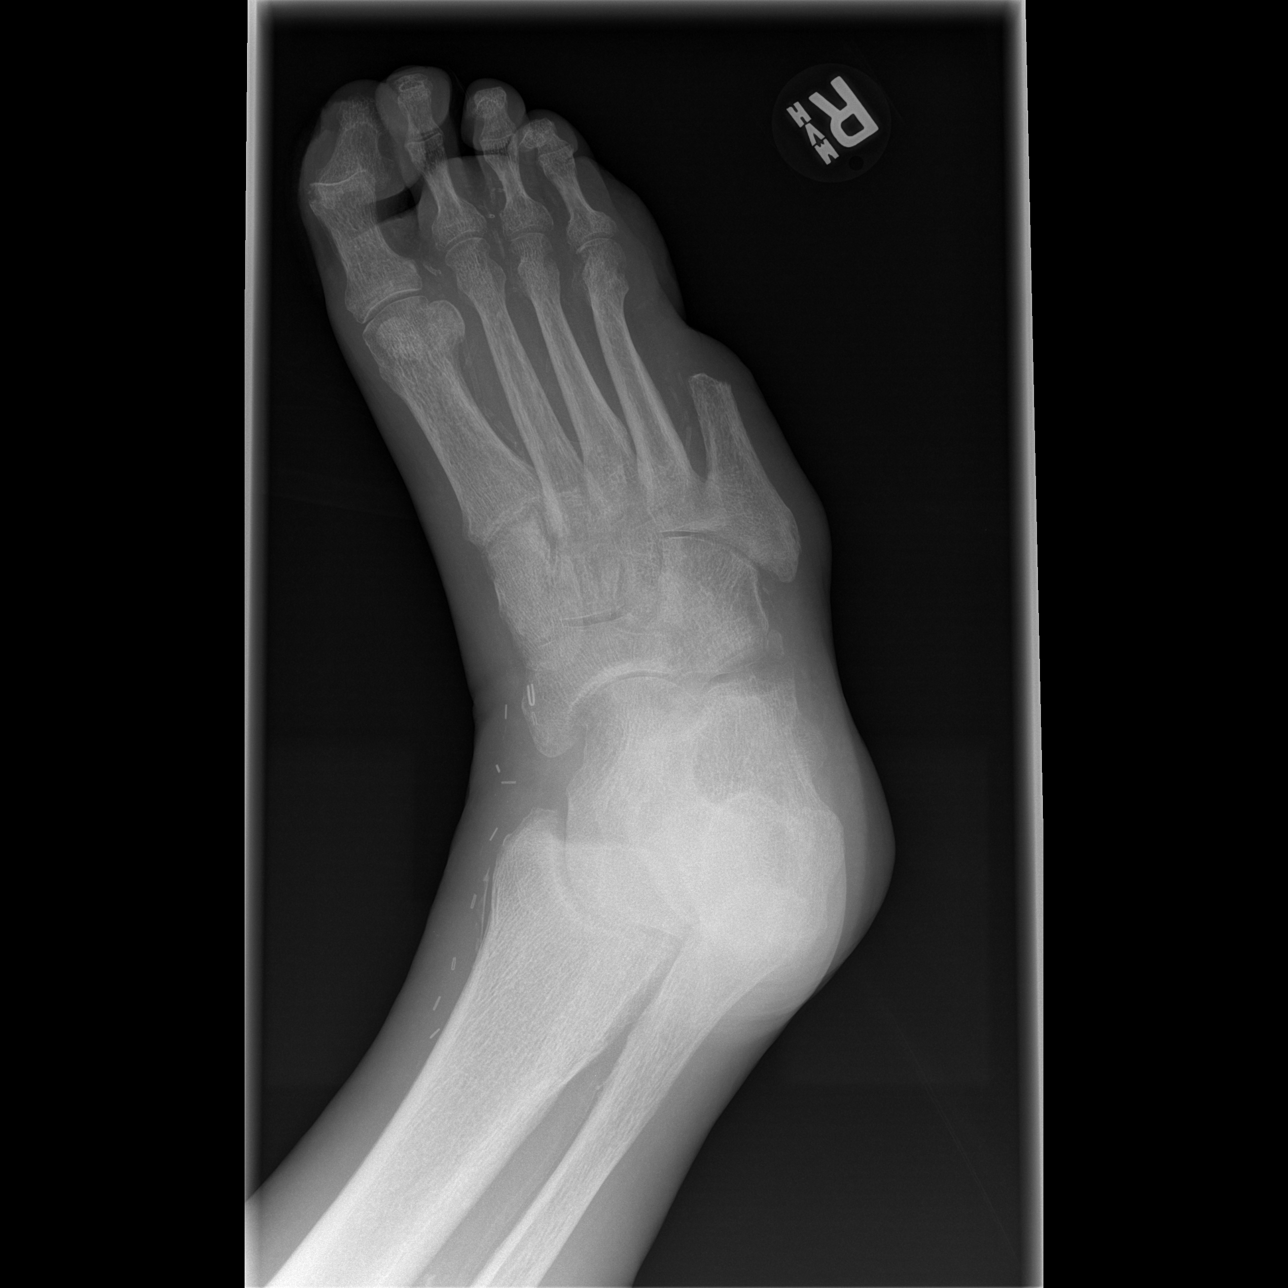

[t foot lat right *]
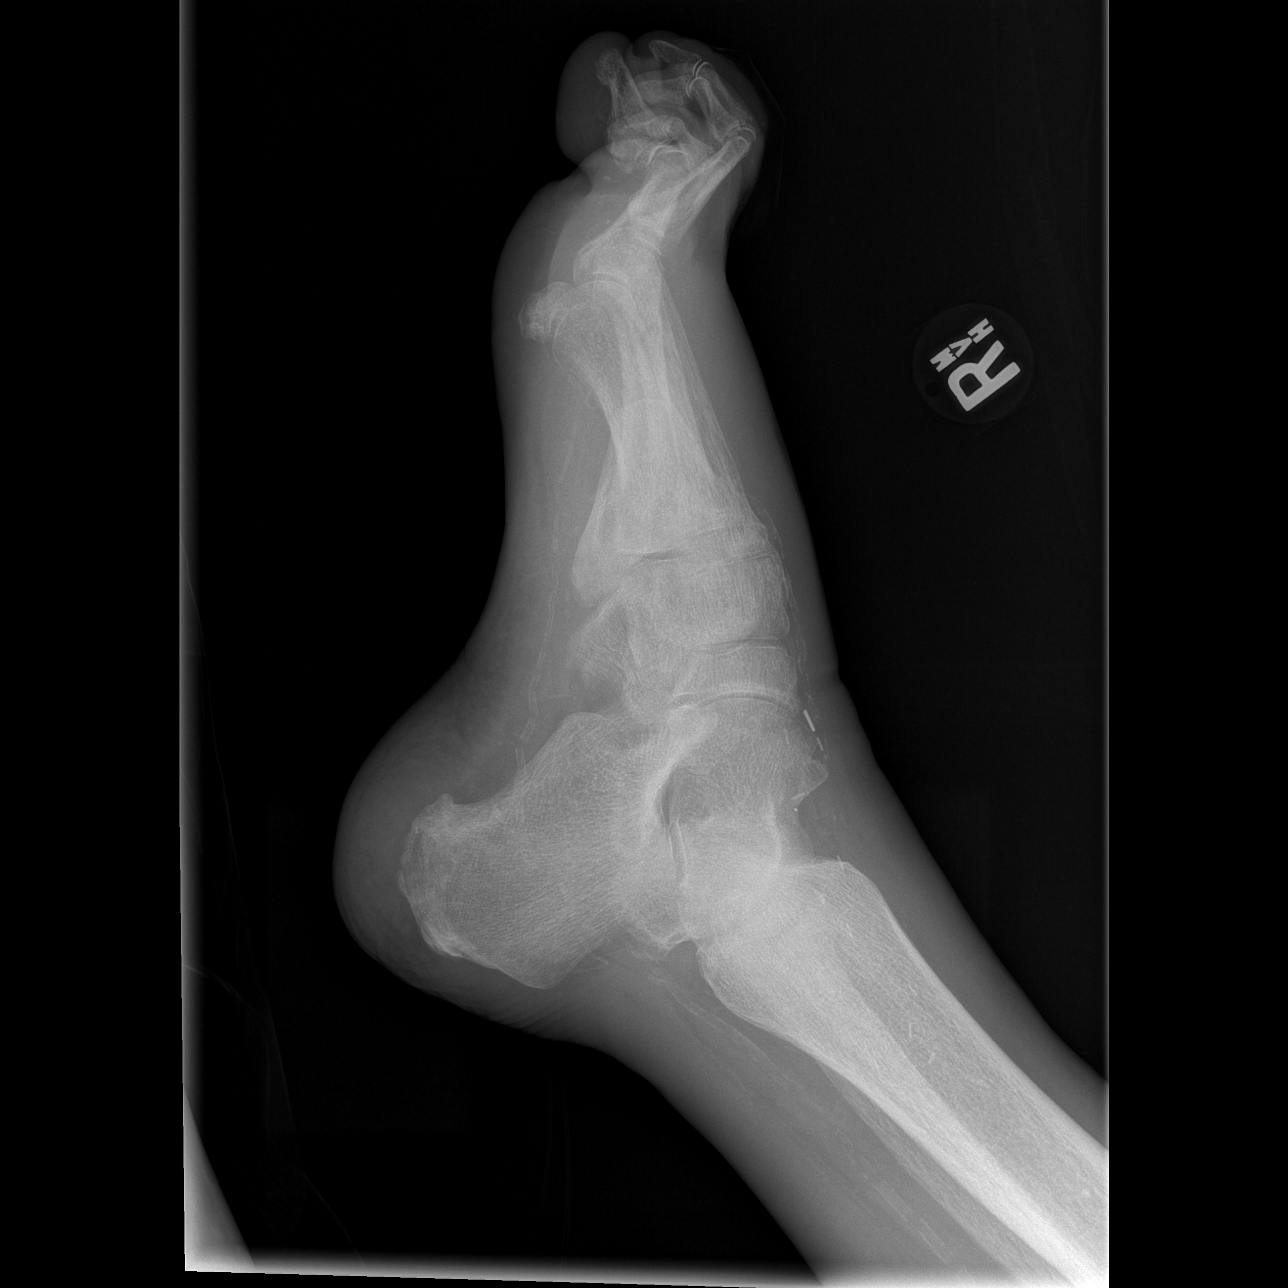

[3 of 3 positions shown; findings below may reference images not displayed]

FINDINGS: Frontal, oblique, and lateral views obtained. Patient has had
amputation at the level of the mid fifth metatarsal. Bones are
osteoporotic. There is somewhat greater osteoporosis in the distal
second, third, and fourth metatarsals than elsewhere. This
appearance is stable compared to prior study. There is no acute
fracture or dislocation. There is no overt bony destruction or
erosion. There are flexion deformities of all remaining MTP, PIP,
and DIP joints. Postoperative changes noted in the ankle regions.
There is extensive arterial vascular calcification consistent with
diabetes mellitus.
IMPRESSION: Status post amputation midportion fifth metatarsal. Osteoporosis
with osteoporosis most severe in the distal second, third, and
fourth metatarsals. This appearance is stable compared to prior
study. While no focal bony destruction is seen, subtle osteomyelitis
in these areas may be quite difficult to discern given the degree of
osteoporosis in these areas.

There is flexion of all MTP, PIP, and DIP joints. No acute fracture
or dislocation. Extensive vascular calcification.

If there remains concern for potential bony destruction, MR or
three-phase nuclear medicine bone scan could be helpful for further
assessment.

## 2018-09-28 IMAGING — MR MR FOOT*R* W/O CM
4 of 5 series · 19 of 40 positions shown · non-contrast
Comparison: None.

CLINICAL DATA: Open wound between the second and third toes.
Evaluate for osteomyelitis.

EXAM:
MRI OF THE RIGHT FOREFOOT WITHOUT CONTRAST
TECHNIQUE: Multiplanar, multisequence MR imaging of the right forefoot was
performed. No intravenous contrast was administered.

[Series 2: T1 · coronal · 4.0mm · 0.29mm/px · 8 of 32 slices shown (1 of 2)]
[im 1/32]
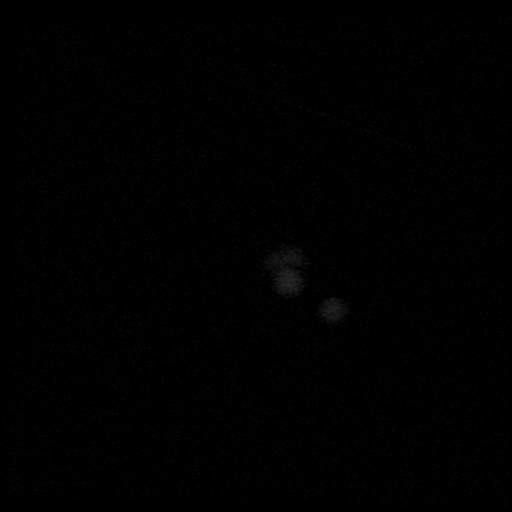
[im 4/32]
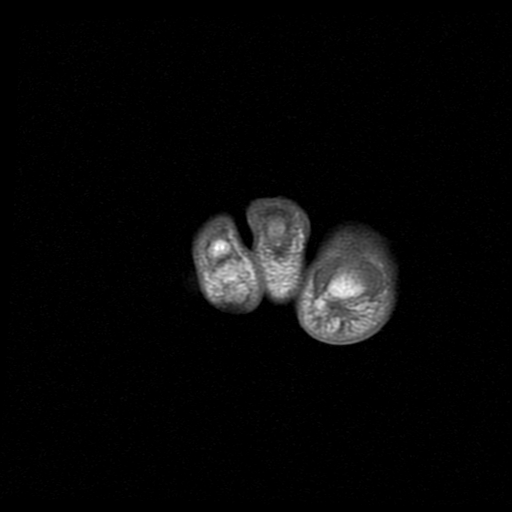
[im 11/32]
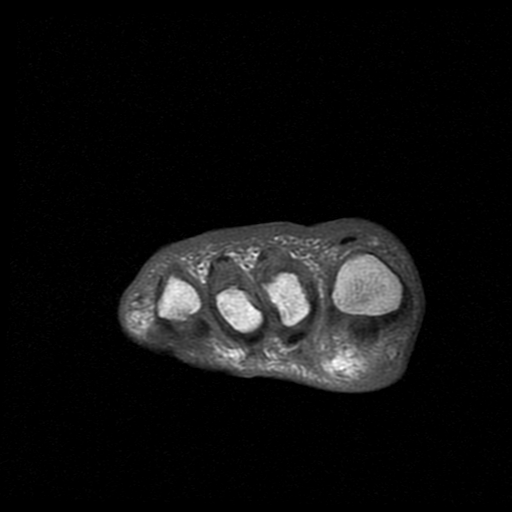
[im 14/32]
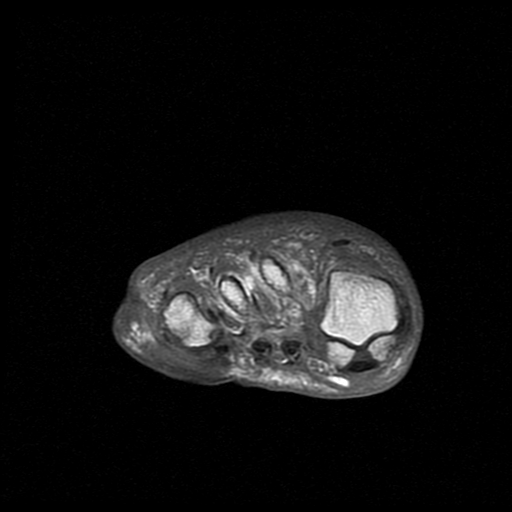
[im 18/32]
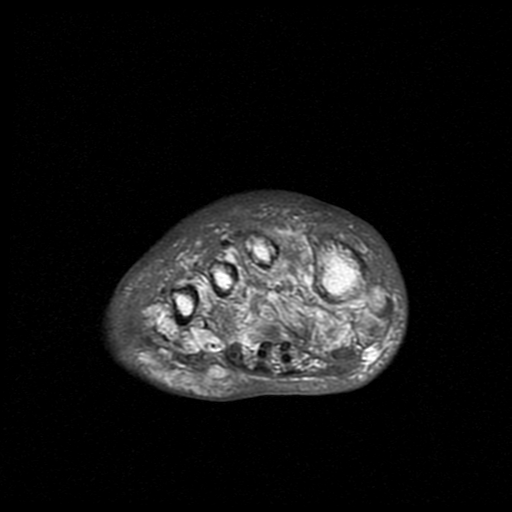
[im 21/32]
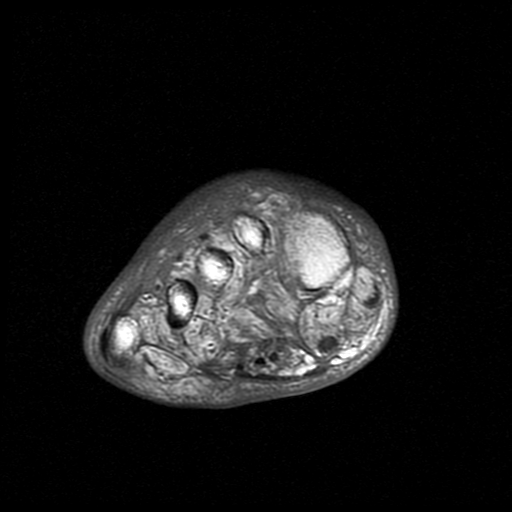
[im 28/32]
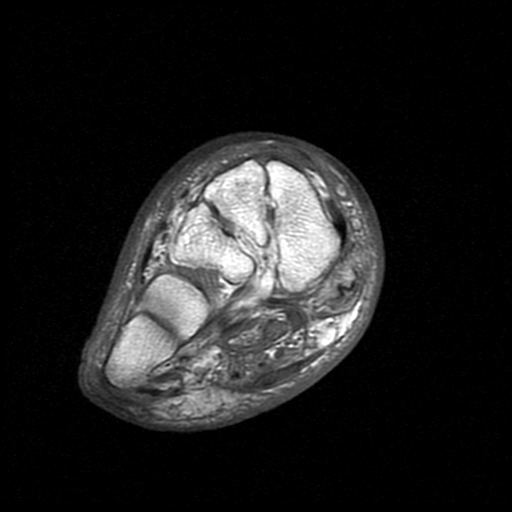
[im 32/32]
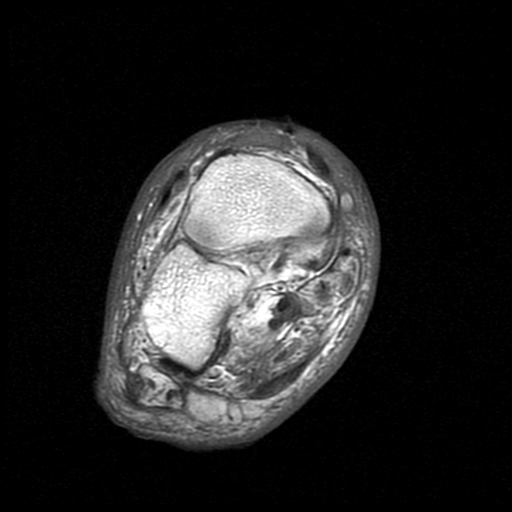

[Series 3: T2 · coronal · 4.0mm · 0.29mm/px · 3 of 34 slices shown]
[im 4/34]
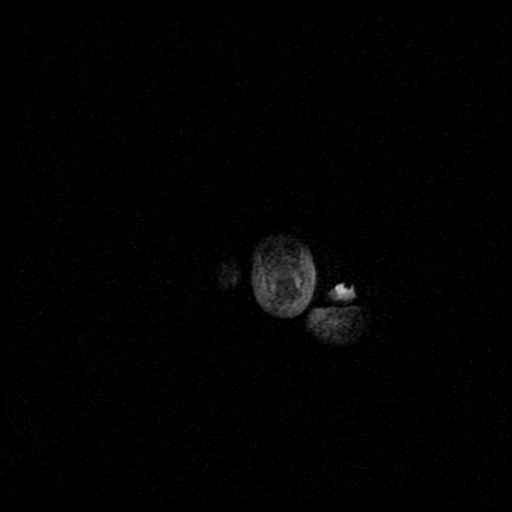
[im 17/34]
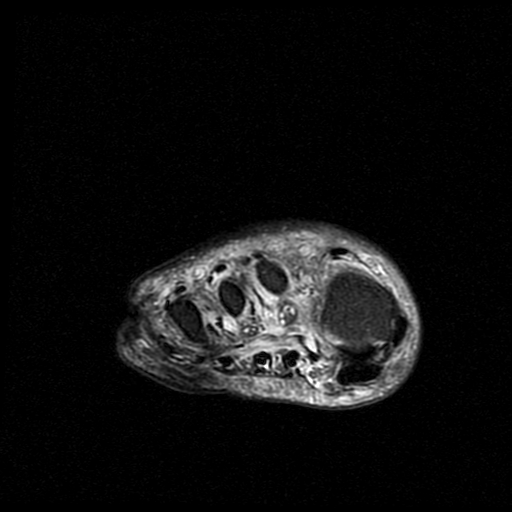
[im 30/34]
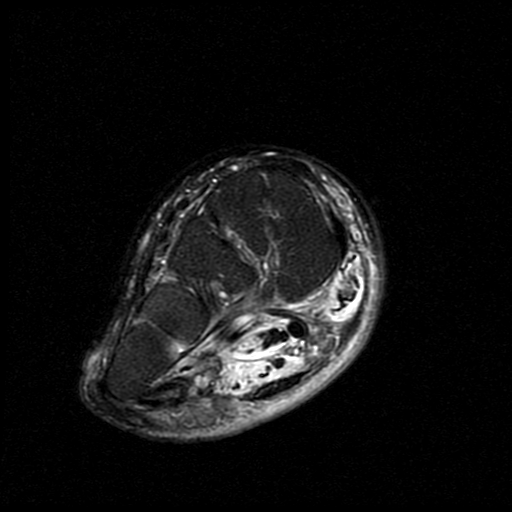

[Series 5: STIR · axial · 4.0mm · 0.31mm/px · z∈[-89,-29]mm · 3 of 18 slices shown]
[im 4/18]
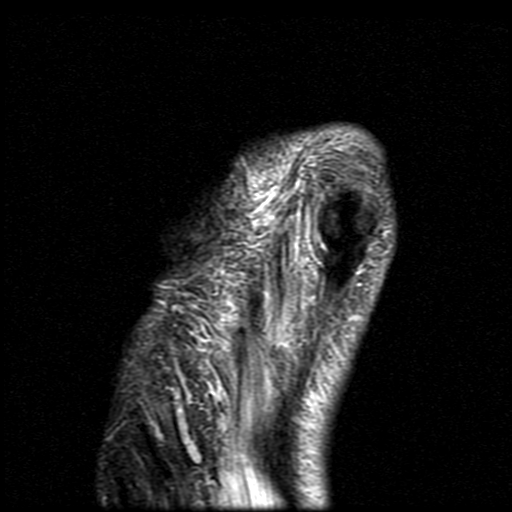
[im 11/18]
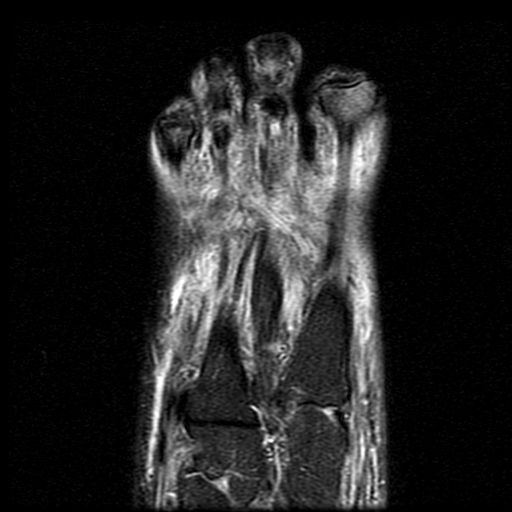
[im 18/18]
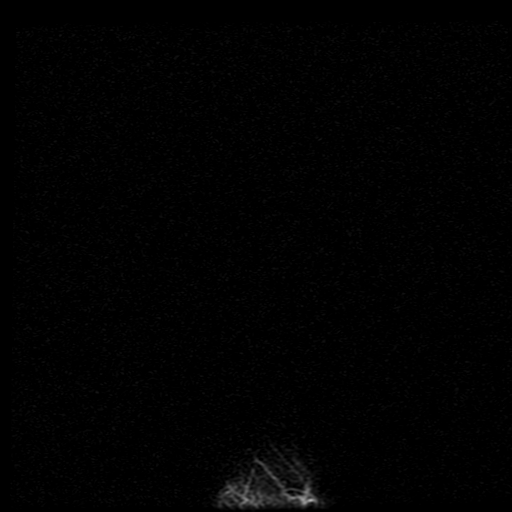

[Series 6: T1 · axial · 4.0mm · 0.31mm/px · z∈[-102,-30]mm · 5 of 18 slices shown (2 of 2)]
[im 1/18]
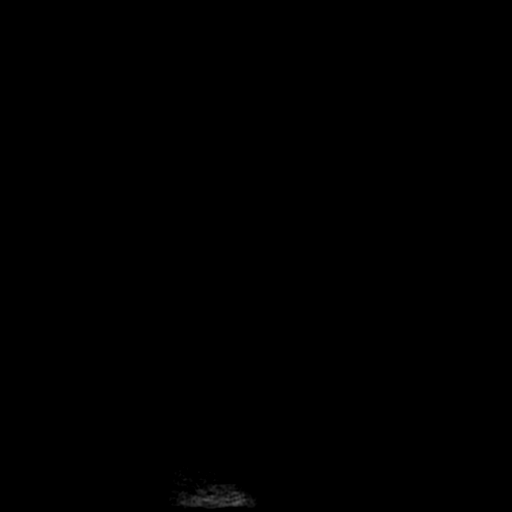
[im 4/18]
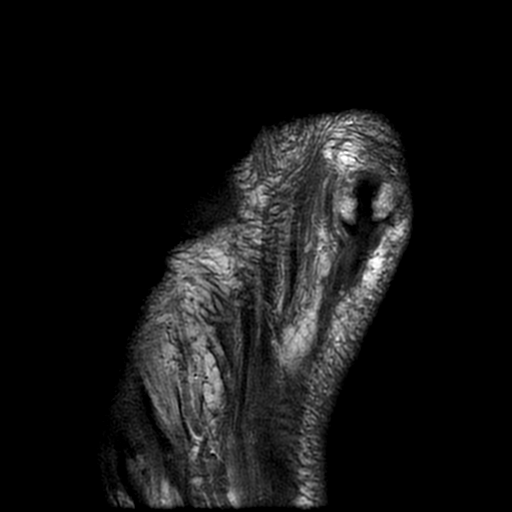
[im 7/18]
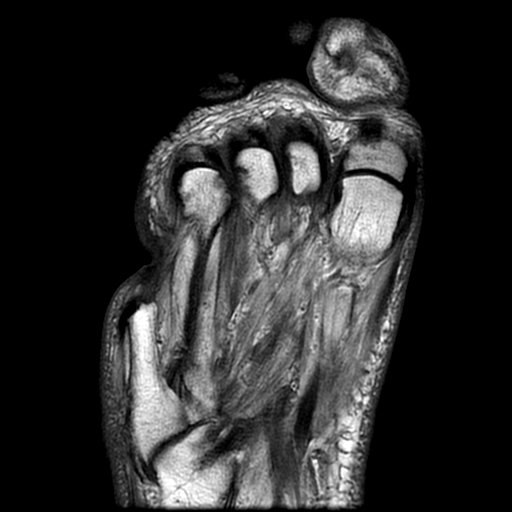
[im 11/18]
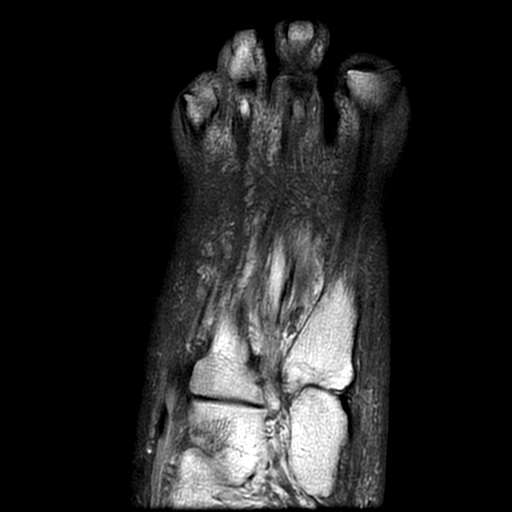
[im 18/18]
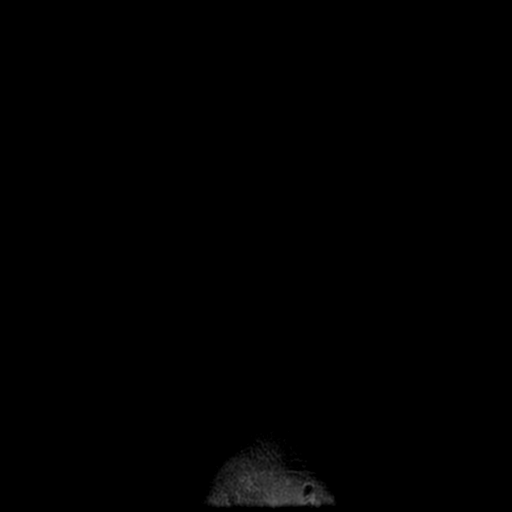

[19 of 40 positions shown; findings below may reference images not displayed]

FINDINGS: Bones/Joint/Cartilage

Soft tissue ulcer overlying the dorsal aspect of the second PIP
joint with severe marrow edema in the second proximal phalanx and
second middle phalanx with corresponding T1 hypointensity concerning
for osteomyelitis.

Mild marrow edema in the distal aspect of the first proximal phalanx
and base of the first distal phalanx which may reflect reactive
marrow changes versus mild early osteomyelitis.

Partial amputation of the fifth metatarsal. No acute fracture or
dislocation. Normal alignment. No joint effusion.

Ligaments

Collateral ligaments are intact.  Intact Lisfranc ligament.

Muscles and Tendons
Flexor, peroneal and extensor compartment tendons are intact.
Diffuse T2 hyperintense signal throughout the plantar musculature
likely neurogenic.

Soft tissue
No fluid collection or hematoma.  No soft tissue mass.
IMPRESSION: 1. Soft tissue ulcer overlying the dorsal aspect of the second PIP
joint with severe marrow edema in the second proximal phalanx and
second middle phalanx with corresponding T1 hypointensity concerning
for osteomyelitis.
2. Mild marrow edema in the distal aspect of the first proximal
phalanx and base of the first distal phalanx which may reflect
reactive marrow changes versus mild early osteomyelitis.

## 2019-03-02 DEATH — deceased
